# Patient Record
Sex: Male | Born: 1955
Health system: Southern US, Community
[De-identification: ages and names within clinical notes are randomized; demographics above are authoritative.]

## PROBLEM LIST (undated history)

## (undated) DIAGNOSIS — J849 Interstitial pulmonary disease, unspecified: Secondary | ICD-10-CM

## (undated) DIAGNOSIS — K649 Unspecified hemorrhoids: Secondary | ICD-10-CM

## (undated) DIAGNOSIS — E785 Hyperlipidemia, unspecified: Secondary | ICD-10-CM

## (undated) DIAGNOSIS — R000 Tachycardia, unspecified: Secondary | ICD-10-CM

## (undated) DIAGNOSIS — K219 Gastro-esophageal reflux disease without esophagitis: Secondary | ICD-10-CM

## (undated) HISTORY — DX: Unspecified hemorrhoids: K64.9

## (undated) HISTORY — DX: Hyperlipidemia, unspecified: E78.5

## (undated) HISTORY — DX: Gastro-esophageal reflux disease without esophagitis: K21.9

## (undated) HISTORY — DX: Interstitial pulmonary disease, unspecified: J84.9

## (undated) HISTORY — DX: Tachycardia, unspecified: R00.0

---

## 2006-12-01 HISTORY — PX: HERNIA REPAIR: SHX51

## 2015-12-10 ENCOUNTER — Encounter: Payer: Self-pay | Admitting: Internal Medicine

## 2015-12-10 ENCOUNTER — Ambulatory Visit (HOSPITAL_COMMUNITY)
Admission: RE | Admit: 2015-12-10 | Discharge: 2015-12-10 | Disposition: A | Discharge status: Home / Self Care | Payer: Self-pay | Source: Ambulatory Visit | Attending: Internal Medicine | Admitting: Internal Medicine

## 2015-12-10 ENCOUNTER — Ambulatory Visit (INDEPENDENT_AMBULATORY_CARE_PROVIDER_SITE_OTHER): Payer: Self-pay | Admitting: Internal Medicine

## 2015-12-10 VITALS — BP 139/89 | HR 107 | Temp 97.8°F | Wt 182.8 lb

## 2015-12-10 DIAGNOSIS — E7800 Pure hypercholesterolemia, unspecified: Secondary | ICD-10-CM

## 2015-12-10 DIAGNOSIS — R053 Chronic cough: Secondary | ICD-10-CM | POA: Insufficient documentation

## 2015-12-10 DIAGNOSIS — R05 Cough: Secondary | ICD-10-CM

## 2015-12-10 DIAGNOSIS — R000 Tachycardia, unspecified: Secondary | ICD-10-CM

## 2015-12-10 DIAGNOSIS — R918 Other nonspecific abnormal finding of lung field: Secondary | ICD-10-CM | POA: Insufficient documentation

## 2015-12-10 DIAGNOSIS — Z0000 Encounter for general adult medical examination without abnormal findings: Secondary | ICD-10-CM | POA: Insufficient documentation

## 2015-12-10 DIAGNOSIS — Z720 Tobacco use: Secondary | ICD-10-CM

## 2015-12-10 DIAGNOSIS — K649 Unspecified hemorrhoids: Secondary | ICD-10-CM

## 2015-12-10 DIAGNOSIS — K644 Residual hemorrhoidal skin tags: Secondary | ICD-10-CM | POA: Insufficient documentation

## 2015-12-10 DIAGNOSIS — F17210 Nicotine dependence, cigarettes, uncomplicated: Secondary | ICD-10-CM

## 2015-12-10 HISTORY — DX: Unspecified hemorrhoids: K64.9

## 2015-12-10 MED ORDER — HYDROCORTISONE 2.5 % RE CREA: 1.0000 "application " | TOPICAL_CREAM | Freq: Two times a day (BID) | RECTAL | Status: DC

## 2015-12-10 MED ORDER — GUAIFENESIN-DM 100-10 MG/5ML PO SYRP: 5.0000 mL | ORAL_SOLUTION | ORAL | Status: DC | PRN

## 2015-12-10 NOTE — Assessment & Plan Note (Addendum)
Patient reports a history of high cholesterol for which he was previously on medication. No results in our system.   Plan: -Repeat lipid profile  Lipid Panel     Component Value Date/Time   CHOL 173 12/10/2015 1515   TRIG 211* 12/10/2015 1515   HDL 27* 12/10/2015 1515   CHOLHDL 6.4* 12/10/2015 1515   LDLCALC 104* 12/10/2015 1515   Patient would benefit from pravastatin 40 mg daily at the [facility] Department. However, patient does not yet have the orange card. Consider prescribing this once established with the orange card.

## 2015-12-10 NOTE — Progress Notes (Signed)
   Subjective:    Patient ID: Joshua Kennedy, male    DOB: [birth year], 60 y.o.   MRN: [id number]  HPI Joshua Kennedy is a 60 y.o. male with PMHx of tobacco abuse who presents to the clinic to establish care and for cough. Please see A&P for the status of the patient's chronic medical problems.   Past Medical History  Diagnosis Date  . Hemorrhoids 12/10/15  . Hyperlipidemia   . Tachycardia     Outpatient Encounter Prescriptions as of 12/10/2015  Medication Sig  . guaiFENesin-dextromethorphan (ROBITUSSIN DM) 100-10 MG/5ML syrup Take 5 mLs by mouth every 4 (four) hours as needed for cough.  . hydrocortisone (ANUSOL-HC) 2.5 % rectal cream Place 1 application rectally 2 (two) times daily.   No facility-administered encounter medications on file as of 12/10/2015.    Family History  Problem Relation Age of Onset  . Diabetes Sister   . Cancer Sister   . Cancer Brother     Social History   Social History  . Marital Status: Legally Separated    Spouse Name: N/A  . Number of Children: N/A  . Years of Education: N/A   Occupational History  . Not on file.   Social History Main Topics  . Smoking status: Current Every Day Smoker -- 1.00 packs/day    Types: Cigarettes  . Smokeless tobacco: Not on file  . Alcohol Use: No  . Drug Use: No  . Sexual Activity: Not on file   Other Topics Concern  . Not on file   Social History Narrative  . No narrative on file   Patient denies surgical history.  Review of Systems General: Admits to weight loss, night sweats. Denies fever, chills, fatigue, change in appetite  Respiratory: Admits to SOB, productive cough, DOE. Denies hemoptysis, chest tightness, and wheezing.   Cardiovascular: Denies chest pain and palpitations.  Gastrointestinal: Admits to post-tussive vomiting, BRBPR when wiping, hemorrhoids. Denies nausea, abdominal pain, diarrhea, constipation, melena and abdominal distention.  Genitourinary: Denies urgency, frequency. Endocrine:  Denies polyuria, and polydipsia. Musculoskeletal: Denies arthralgias and gait problem.  Skin: Denies rash and wounds.  Neurological: Denies dizziness, headaches, weakness, lightheadedness Psychiatric/Behavioral: Denies sleep disturbance and agitation.     Objective:   Physical Exam Filed Vitals:   12/10/15 1431  BP: 139/89  Pulse: 107  Temp: 97.8 F (36.6 C)  TempSrc: Oral  Weight: 182 lb 12.8 oz (82.918 kg)  SpO2: 95%   General: Vital signs reviewed.  Patient is well-developed and well-nourished, in no acute distress and cooperative with exam.  Head: Normocephalic and atraumatic. Eyes: EOMI, conjunctivae normal, no scleral icterus.  Neck: Supple, trachea midline, no carotid bruit present. +Anterior cervical lymphadenopathy bilaterally. Cardiovascular: Tachycardic, regular rhythm, S1 normal, S2 normal, no murmurs, gallops, or rubs. Pulmonary/Chest: Inspiratory crackles in mid-lower left lung field, no wheezes, or rhonchi. No supraclavicular or axillary lymphadenopathy. Abdominal: Soft, non-tender, non-distended, BS +, no guarding present.  Rectal exam: external hemorrhoids noted, grade 1-2. Chaperoned by Lynn Ito. Extremities: No lower extremity edema bilaterally. No cyanosis or clubbing. Skin: Warm, dry and intact. No rashes or erythema. Psychiatric: Normal mood and affect. speech and behavior is normal. Cognition and memory are normal.      Assessment & Plan:   Please see problem based assessment and plan.

## 2015-12-10 NOTE — Assessment & Plan Note (Addendum)
Patient presents with a one month history of productive cough with clear sputum. He admits to associated 8 pound weight loss in one month, night sweats for 3-4 weeks, increased shortness of breath and DOE which is new for him. He denies associated nasal congestion, sinus pressure, sore throat, or nausea, but he will occasionally have post-tussive vomiting. He denies hemoptysis. He recently began smoking again- 1 ppd for one month. Previously he smoked 1 ppd for 30 years, quit for 8 years. He has tried nyquil, dayquil, and cough drop without relief. Physical exam reveals inspiratory crackles in right mid to lower lung field. Patient states he was routinely tested for TB while incarcerated and has been negative. Most recent test was in November. HIV negative in November.  CXR revealed diffusely increased interstitial markings suggest a pneumonitis type process.  DDx includes COPD, malignancy, TB (given incarceration hx), viral URI, or cough related to tobacco use.   Plan: -Robitussin DM prn cough -Follow up in one month -At that time, if not improved, consider CT Chest and PFTs -CXR findings not concerning for malignancy or TB; however, should be kept on differential -Patient is establishing and trying to obtain orange card

## 2015-12-10 NOTE — Assessment & Plan Note (Signed)
Patient admits to one year history of painless rectal bleeding- BRBPR when he wipes, but occasionally in the toilet. He has never had a colonoscopy. He denies a family history of colon cancer. Physical exam reveals grade 1-2 external hemorrhoids.   Plan: -Anusol cream -Check CBC -Referral to GI in February for further management and for screening colonoscopy

## 2015-12-10 NOTE — Assessment & Plan Note (Addendum)
Patient is tachycardic, but regular rhythm on examination. He denies history of a fast heart rate.   Plan: -Check TSH -Consider EKG at follow up if persistent  TSH normal.

## 2015-12-10 NOTE — Patient Instructions (Addendum)
FOR YOUR COUGH: -WE WILL CHECK A CHEST XRAY -TRY ROBITUSSIN (OR GENERIC FORM WITH DEXTROMETHORPHAN) -WORK ON QUITTING SMOKING  FOR YOUR HEMORRHOIDS: -TRY ANUSOL (HYDROCORTISONE CREAM) APPLIED TO OUTSIDE OF RECTUM -WE WILL ALSO CHECK YOUR BLOOD COUNT TODAY -WE WILL REFER YOU TO GASTROENTEROLOGY NEXT MONTH  WE WILL ALSO CHECK YOUR CHOLESTEROL, THYROID, AND BASIC BLOOD WORK TODAY

## 2015-12-10 NOTE — Assessment & Plan Note (Signed)
  Assessment: Progress toward smoking cessation:   Deteriorated Barriers to progress toward smoking cessation:   Desire to quit Comments: 30 pack year history, quit 8 years, restarted 1 ppd one month ago  Plan: Instruction/counseling given:  I counseled patient on the dangers of tobacco use, advised patient to stop smoking, and reviewed strategies to maximize success. Educational resources provided:    Self management tools provided:    Medications to assist with smoking cessation:  None Patient agreed to the following self-care plans for smoking cessation:    Other plans: Follow up one month

## 2015-12-10 NOTE — Assessment & Plan Note (Signed)
Colonoscopy- Needs, no need for FOBT given history of BRBPR likely d/t hemorrhoids, consider referral next month once patient has orange card Flu Vaccine - Refused Hep C- Defer to follow up visit HIV- states was negative November 2016

## 2015-12-11 LAB — LIPID PANEL
CHOLESTEROL TOTAL: 173 mg/dL (ref 100–199)
Chol/HDL Ratio: 6.4 ratio units — ABNORMAL HIGH (ref 0.0–5.0)
HDL: 27 mg/dL — AB (ref >39)
LDL CALC: 104 mg/dL — AB (ref 0–99)
Triglycerides: 211 mg/dL — ABNORMAL HIGH (ref 0–149)
VLDL CHOLESTEROL CAL: 42 mg/dL — AB (ref 5–40)

## 2015-12-11 LAB — TSH: TSH: 1.36 u[IU]/mL (ref 0.450–4.500)

## 2015-12-11 LAB — CMP14 + ANION GAP
A/G RATIO: 0.9 — AB (ref 1.1–2.5)
ALBUMIN: 3.7 g/dL (ref 3.6–4.8)
ALK PHOS: 140 IU/L — AB (ref 39–117)
ALT: 13 IU/L (ref 0–44)
AST: 22 IU/L (ref 0–40)
Anion Gap: 13 mmol/L (ref 10.0–18.0)
BILIRUBIN TOTAL: 0.3 mg/dL (ref 0.0–1.2)
BUN / CREAT RATIO: 8 — AB (ref 10–22)
BUN: 8 mg/dL (ref 8–27)
CHLORIDE: 98 mmol/L (ref 96–106)
CO2: 25 mmol/L (ref 18–29)
Calcium: 9.4 mg/dL (ref 8.6–10.2)
Creatinine, Ser: 1.03 mg/dL (ref 0.76–1.27)
GFR calc non Af Amer: 79 mL/min/{1.73_m2} (ref >59)
GFR, EST AFRICAN AMERICAN: 91 mL/min/{1.73_m2} (ref >59)
GLOBULIN, TOTAL: 3.9 g/dL (ref 1.5–4.5)
Glucose: 84 mg/dL (ref 65–99)
POTASSIUM: 4.9 mmol/L (ref 3.5–5.2)
SODIUM: 136 mmol/L (ref 134–144)
TOTAL PROTEIN: 7.6 g/dL (ref 6.0–8.5)

## 2015-12-11 LAB — CBC
Hematocrit: 41.3 % (ref 37.5–51.0)
Hemoglobin: 13.3 g/dL (ref 12.6–17.7)
MCH: 26.1 pg — ABNORMAL LOW (ref 26.6–33.0)
MCHC: 32.2 g/dL (ref 31.5–35.7)
MCV: 81 fL (ref 79–97)
PLATELETS: 448 10*3/uL — AB (ref 150–379)
RBC: 5.09 x10E6/uL (ref 4.14–5.80)
RDW: 15.3 % (ref 12.3–15.4)
WBC: 10.4 10*3/uL (ref 3.4–10.8)

## 2015-12-11 NOTE — Progress Notes (Signed)
Internal Medicine Clinic Attending  Case discussed with Dr. Richardson at the time of the visit.  We reviewed the resident's history and exam and pertinent patient test results.  I agree with the assessment, diagnosis, and plan of care documented in the resident's note. 

## 2015-12-12 MED ORDER — PRAVASTATIN SODIUM 40 MG PO TABS: 40.0000 mg | ORAL_TABLET | Freq: Every day | ORAL | Status: DC

## 2015-12-12 NOTE — Addendum Note (Signed)
Addended by: Aldean Baker on: 12/12/2015 10:22 AM   Modules accepted: Orders, Medications

## 2016-01-11 ENCOUNTER — Ambulatory Visit (INDEPENDENT_AMBULATORY_CARE_PROVIDER_SITE_OTHER): Payer: Self-pay | Admitting: Internal Medicine

## 2016-01-11 ENCOUNTER — Encounter: Payer: Self-pay | Admitting: Internal Medicine

## 2016-01-11 VITALS — BP 127/85 | HR 106 | Temp 98.3°F | Ht 70.0 in | Wt 178.6 lb

## 2016-01-11 DIAGNOSIS — R000 Tachycardia, unspecified: Secondary | ICD-10-CM

## 2016-01-11 DIAGNOSIS — R634 Abnormal weight loss: Secondary | ICD-10-CM

## 2016-01-11 DIAGNOSIS — B029 Zoster without complications: Secondary | ICD-10-CM

## 2016-01-11 DIAGNOSIS — Z09 Encounter for follow-up examination after completed treatment for conditions other than malignant neoplasm: Secondary | ICD-10-CM

## 2016-01-11 NOTE — Progress Notes (Signed)
Patient ID: HILMER ALIBERTI, male   DOB: 07/17/1956, 60 y.o.   MRN: [id number]   Subjective:   Patient ID: TRUNG WENZL male   DOB: [birth year] 60 y.o.   MRN: [id number]  HPI: Mr.Damondre E Blancett is a 60 y.o. with PMH tobacco abuse, presented today to follow up on his Cough, weightloss, but also with c/o healing rash on his butt.  Past Medical History  Diagnosis Date  . Hemorrhoids 12/10/15  . Hyperlipidemia   . Tachycardia    Review of Systems: CONSTITUTIONAL- No Fever, or change in appetite. SKIN-  Rash on butt HEAD- No Headache or dizziness. Mouth/throat- No Sorethroat . RESPIRATORY- No Cough or SOB. CARDIAC- No Palpitations,  or chest pain. GI- No vomiting, diarrhoea, abd pain. URINARY- No Frequency or dysuria. NEUROLOGIC- No  syncope, seizures  Objective:  Physical Exam: Filed Vitals:   01/11/16 1416  BP: 127/85  Pulse: 106  Temp: 98.3 F (36.8 C)  TempSrc: Oral  Height: 5\' 10"  (1.778 m)  Weight: 178 lb 9.6 oz (81.012 kg)  SpO2: 100%   GENERAL- alert, co-operative, appears as stated age, not in any distress. HEENT- Atraumatic, normocephalic, PERRL, neck supple. CARDIAC- RRR, no murmurs, rubs or gallops. RESP- clear to auscultation bilaterally, no wheezes or crackles. ABDOMEN- Soft, nontender, bowel sounds present. BACK- Normal curvature of the spine, No tenderness along the vertebrae, no CVA tenderness. NEURO- No obvious Cr N abnormality, strenght upper and lower extremities intact, Gait- Normal. EXTREMITIES- pulse 2+, symmetric, no pedal edema. SKIN- Warm, dry, healing, scabbed over rash - reddish, on right butt cheek, oblique- along a dermatome- L4 or L5., non tender, not pruritic PSYCH- Normal mood and affect, appropriate thought content and speech.  Assessment & Plan:   The patient's case and plan of care was discussed with attending physician, Dr. .  Please see problem based charting for assessment and plan.

## 2016-01-11 NOTE — Assessment & Plan Note (Signed)
Healed and scabbed over lesions on butt- L4 or L5 dermatome. Says he had HIV test done prior to leaving prison- December 1st.  Plan-  repeat HIV test after pt gets orange card

## 2016-01-11 NOTE — Patient Instructions (Addendum)
We want to get a chest CT scan as soon as possible. This is because you are loosing weight, have a cough and have family members that have had a lung Cancer.   Shingles Shingles, which is also known as herpes zoster, is an infection that causes a painful skin rash and fluid-filled blisters. Shingles is not related to genital herpes, which is a sexually transmitted infection.   Shingles only develops in people who:  Have had chickenpox.  Have received the chickenpox vaccine. (This is rare.) CAUSES Shingles is caused by varicella-zoster virus (VZV). This is the same virus that causes chickenpox. After exposure to VZV, the virus stays in the body in an inactive (dormant) state. Shingles develops if the virus reactivates. This can happen many years after the initial exposure to VZV. It is not known what causes this virus to reactivate. RISK FACTORS People who have had chickenpox or received the chickenpox vaccine are at risk for shingles. Infection is more common in people who:  Are older than age 61.  Have a weakened defense (immune) system, such as those with HIV, AIDS, or cancer.  Are taking medicines that weaken the immune system, such as transplant medicines.  Are under great stress. SYMPTOMS Early symptoms of this condition include itching, tingling, and pain in an area on your skin. Pain may be described as burning, stabbing, or throbbing. A few days or weeks after symptoms start, a painful red rash appears, usually on one side of the body in a bandlike or beltlike pattern. The rash eventually turns into fluid-filled blisters that break open, scab over, and dry up in about 2-3 weeks. At any time during the infection, you may also develop:  A fever.  Chills.  A headache.  An upset stomach. DIAGNOSIS This condition is diagnosed with a skin exam. Sometimes, skin or fluid samples are taken from the blisters before a diagnosis is made. These samples are examined under a microscope  or sent to a lab for testing. TREATMENT There is no specific cure for this condition. Your health care provider will probably prescribe medicines to help you manage pain, recover more quickly, and avoid long-term problems. Medicines may include:  Antiviral drugs.  Anti-inflammatory drugs.  Pain medicines. If the area involved is on your face, you may be referred to a specialist, such as an eye doctor (ophthalmologist) or an ear, nose, and throat (ENT) doctor to help you avoid eye problems, chronic pain, or disability. HOME CARE INSTRUCTIONS Medicines  Take medicines only as directed by your health care provider.  Apply an anti-itch or numbing cream to the affected area as directed by your health care provider. Blister and Rash Care  Take a cool bath or apply cool compresses to the area of the rash or blisters as directed by your health care provider. This may help with pain and itching.  Keep your rash covered with a loose bandage (dressing). Wear loose-fitting clothing to help ease the pain of material rubbing against the rash.  Keep your rash and blisters clean with mild soap and cool water or as directed by your health care provider.  Check your rash every day for signs of infection. These include redness, swelling, and pain that lasts or increases.  Do not pick your blisters.  Do not scratch your rash. General Instructions  Rest as directed by your health care provider.  Keep all follow-up visits as directed by your health care provider. This is important.  Until your blisters scab over, your  infection can cause chickenpox in people who have never had it or been vaccinated against it. To prevent this from happening, avoid contact with other people, especially:  Babies.  Pregnant women.  Children who have eczema.  Elderly people who have transplants.  People who have chronic illnesses, such as leukemia or AIDS. SEEK MEDICAL CARE IF:  Your pain is not relieved with  prescribed medicines.  Your pain does not get better after the rash heals.  Your rash looks infected. Signs of infection include redness, swelling, and pain that lasts or increases. SEEK IMMEDIATE MEDICAL CARE IF:  The rash is on your face or nose.  You have facial pain, pain around your eye area, or loss of feeling on one side of your face.  You have ear pain or you have ringing in your ear.  You have loss of taste.  Your condition gets worse.

## 2016-01-11 NOTE — Assessment & Plan Note (Signed)
Due for colonoscopy. Order after getting the orange card.

## 2016-01-11 NOTE — Assessment & Plan Note (Signed)
Patient says he has lost weight, weighed- 197- dec 1st, today he is weighing- 178. He says he has been eating very well with good appetite. Pt also has a 2 month cough  That is about the same, productive of clear sputum. Family hx of Lung cancer in Sister, and brother. Pts chest xray did not show any obvious masses but showed increased interstitial markings.   Plan- Considering- Smoking hx, cough, weightloss, family hx, pt needs a Ct scan ASAP to evaluate for lung masses. - High Resolution Ct scan would be more beneficial, considering interstitial marking, rule out interstitial lund dx, also evaluate for lung masses. - he needs to be a resident of [place] for [place] days, and it cannot be made retroactive to cover prior costs. Pt says he has no money. - next appoint for march 1st when he gets the orange card.

## 2016-01-11 NOTE — Assessment & Plan Note (Signed)
Regular, get EKG next visit after orange card.

## 2016-01-14 NOTE — Progress Notes (Signed)
Internal Medicine Clinic Attending  Case discussed with Dr. Mariea Kennedy soon after the resident saw the patient.  We reviewed the resident's history and exam and pertinent patient test results.  I agree with the assessment, diagnosis, and plan of care documented in the resident's note. Dr Joshua Kennedy and I viewed and interpreted the pt's CXR image. Pt's medical care is limited bc just got out of jail and not eligible for medicaid / food stamps for 6 months nor orange card for 90 day. I conferred with Joshua Kennedy who confirmed this. Orange card is not retroactive. Pt cannot afford W/U now so care will be delayed.

## 2016-01-17 ENCOUNTER — Encounter: Payer: Self-pay | Admitting: *Deleted

## 2016-01-31 ENCOUNTER — Ambulatory Visit: Payer: Self-pay | Admitting: Internal Medicine

## 2016-02-07 ENCOUNTER — Ambulatory Visit (INDEPENDENT_AMBULATORY_CARE_PROVIDER_SITE_OTHER): Payer: Self-pay | Admitting: Internal Medicine

## 2016-02-07 ENCOUNTER — Ambulatory Visit: Payer: Self-pay

## 2016-02-07 ENCOUNTER — Encounter: Payer: Self-pay | Admitting: Internal Medicine

## 2016-02-07 VITALS — BP 121/87 | HR 92 | Temp 97.8°F | Ht 70.0 in | Wt 177.6 lb

## 2016-02-07 DIAGNOSIS — R053 Chronic cough: Secondary | ICD-10-CM

## 2016-02-07 DIAGNOSIS — R05 Cough: Secondary | ICD-10-CM

## 2016-02-07 DIAGNOSIS — F17200 Nicotine dependence, unspecified, uncomplicated: Secondary | ICD-10-CM

## 2016-02-07 DIAGNOSIS — Z720 Tobacco use: Secondary | ICD-10-CM

## 2016-02-07 NOTE — Patient Instructions (Signed)
Please work on getting your orange card.  Take cough medication as needed for cough. We will do the CT scan whenever you have your insurance along with some blood work.

## 2016-02-07 NOTE — Assessment & Plan Note (Signed)
Smoked for many ears until 2008 when he had to stop smoking for prison rules. Now started smoking again since (11/2015) getting out of prison, 1 PPD. Discussed smoking cessation.

## 2016-02-07 NOTE — Assessment & Plan Note (Signed)
Chronic productive cough in a smoker who was incarcerated for 28 years, just got out prison on 11/2015. Cough started since around December. No GERD symptoms, no post nasal drip, not on Ace I. Has family hx of lung cancer (brother and sister). Has weight loss ~12 lbs, no night sweats or hemophthisis. Per patient, had TB test and HIV test in prison before getting out and was negative. Had some exposure to aluminum and vinyl when he worked in the past.  CXR on 12/2015 showed interstitial thickening.  Ddx: TB, chronic bronchitis worsened by recent resumption of smoking, interstitial lung disease.  Still waiting for orange card. States he will get it today. Will have to confirm this with Jaynee Eagles. - collected sputum sample will send off for [employer] NAAT and culture. - ordered high res CT, hopefully he will be able to get the orange card today.  - will do further workup including HIV testing next visit when he gets his orange card. F/up in 1 month.

## 2016-02-07 NOTE — Progress Notes (Signed)
   Subjective:    Patient ID: Joshua Kennedy, male    DOB: 1956-01-22, 60 y.o.   MRN: [id number]  HPI  60 yo male with external hemorrhoids, chronic cough, tobacco use, shingles, weight loss here for f/up   Last visit on 01/11/16: had healed herpes zoster lesions on L4-L5 area on his buttock. Plan was HIV test after he gets orange card. For weght loss (~12 lbs in 2 months and 2 months of cough, family hx of lung cancer in sister and brother) CXR was done recently which showed diffuse interstitial marking. Plan was CT chest for further workup which was delayed until he receives his orange card.   He continues to have cough with productive sputum. Cough worse at night. Wakes up from sleep 4-5 times feeling like he will choke, has to spit out the sputum then go back to sleep. Has milder cough during the day. Continues to smoke 1ppd (restarted smoking after quitting for last 9 years after he got out prison on December, this is same time that he started having the cough). Denies GERD symptoms. Not on AceI. Denies post nasal drip. Denies wheezing/sob. In December he was in 190 lb range now he is 177.6 lb.  Review of Systems  Constitutional: Positive for unexpected weight change. Negative for fever, chills and fatigue.  HENT: Negative for congestion, dental problem, ear pain, postnasal drip, rhinorrhea, sinus pressure, sneezing, sore throat and trouble swallowing.   Eyes: Negative for photophobia and visual disturbance.  Respiratory: Positive for cough and choking. Negative for chest tightness, shortness of breath and wheezing.   Cardiovascular: Negative for chest pain, palpitations and leg swelling.  Gastrointestinal: Negative for nausea, vomiting, abdominal pain, diarrhea and abdominal distention.  Endocrine: Negative for polyuria.  Genitourinary: Negative for dysuria and difficulty urinating.  Musculoskeletal: Negative for back pain and arthralgias.  Skin: Negative.   Allergic/Immunologic:  Negative.   Neurological: Negative for dizziness, facial asymmetry, light-headedness, numbness and headaches.  Hematological: Negative for adenopathy. Does not bruise/bleed easily.  Psychiatric/Behavioral: Negative.        Objective:   Physical Exam  Constitutional: He is oriented to person, place, and time. He appears well-developed and well-nourished.  HENT:  Head: Normocephalic and atraumatic.  Mouth/Throat: No oropharyngeal exudate.  Eyes: Conjunctivae are normal. Pupils are equal, round, and reactive to light.  Neck: Normal range of motion.  Cardiovascular: Normal rate and regular rhythm.  Exam reveals no gallop and no friction rub.   No murmur heard. Pulmonary/Chest: Effort normal.  Has dry crackles bilaterally on lower lobes. No wheezing.  Abdominal: Soft. Bowel sounds are normal. He exhibits no distension. There is no tenderness.  Musculoskeletal: Normal range of motion. He exhibits no edema or tenderness.  Lymphadenopathy:    He has no cervical adenopathy.  Neurological: He is alert and oriented to person, place, and time. No cranial nerve deficit.  Skin: Skin is warm.     Filed Vitals:   02/07/16 1000  BP: 121/87  Pulse: 92  Temp: 97.8 F (36.6 C)        Assessment & Plan:  See problem based a&p.

## 2016-02-08 NOTE — Progress Notes (Signed)
Medicine attending: Medical history, presenting problems, physical findings, and medications, reviewed with resident physician Dr Tasrif Ahmed on the day of the patient visit and I concur with his evaluation and management plan. 

## 2016-02-14 ENCOUNTER — Ambulatory Visit (HOSPITAL_COMMUNITY): Payer: Self-pay

## 2016-02-23 ENCOUNTER — Encounter (HOSPITAL_COMMUNITY): Payer: Self-pay | Admitting: Emergency Medicine

## 2016-02-23 ENCOUNTER — Emergency Department (HOSPITAL_COMMUNITY): Payer: Self-pay

## 2016-02-23 ENCOUNTER — Emergency Department (HOSPITAL_COMMUNITY)
Admission: EM | Admit: 2016-02-23 | Discharge: 2016-02-23 | Disposition: A | Discharge status: Home / Self Care | Payer: Self-pay | Attending: Emergency Medicine | Admitting: Emergency Medicine

## 2016-02-23 DIAGNOSIS — J849 Interstitial pulmonary disease, unspecified: Secondary | ICD-10-CM | POA: Insufficient documentation

## 2016-02-23 DIAGNOSIS — R05 Cough: Secondary | ICD-10-CM | POA: Insufficient documentation

## 2016-02-23 DIAGNOSIS — IMO0001 Reserved for inherently not codable concepts without codable children: Secondary | ICD-10-CM | POA: Insufficient documentation

## 2016-02-23 DIAGNOSIS — R059 Cough, unspecified: Secondary | ICD-10-CM

## 2016-02-23 DIAGNOSIS — R079 Chest pain, unspecified: Secondary | ICD-10-CM | POA: Insufficient documentation

## 2016-02-23 DIAGNOSIS — R911 Solitary pulmonary nodule: Secondary | ICD-10-CM | POA: Insufficient documentation

## 2016-02-23 DIAGNOSIS — Z8639 Personal history of other endocrine, nutritional and metabolic disease: Secondary | ICD-10-CM | POA: Insufficient documentation

## 2016-02-23 DIAGNOSIS — R590 Localized enlarged lymph nodes: Secondary | ICD-10-CM | POA: Insufficient documentation

## 2016-02-23 DIAGNOSIS — F17210 Nicotine dependence, cigarettes, uncomplicated: Secondary | ICD-10-CM | POA: Insufficient documentation

## 2016-02-23 LAB — BASIC METABOLIC PANEL
ANION GAP: 10 (ref 5–15)
BUN: 14 mg/dL (ref 6–20)
CHLORIDE: 101 mmol/L (ref 101–111)
CO2: 23 mmol/L (ref 22–32)
Calcium: 8.7 mg/dL — ABNORMAL LOW (ref 8.9–10.3)
Creatinine, Ser: 1.22 mg/dL (ref 0.61–1.24)
GFR calc Af Amer: >60 mL/min (ref >60)
GLUCOSE: 107 mg/dL — AB (ref 65–99)
POTASSIUM: 4 mmol/L (ref 3.5–5.1)
SODIUM: 134 mmol/L — AB (ref 135–145)

## 2016-02-23 LAB — CBC WITH DIFFERENTIAL/PLATELET
BASOS ABS: 0 10*3/uL (ref 0.0–0.1)
Basophils Relative: 0 %
Eosinophils Absolute: 0.1 10*3/uL (ref 0.0–0.7)
Eosinophils Relative: 1 %
HCT: 36.7 % — ABNORMAL LOW (ref 39.0–52.0)
HEMOGLOBIN: 11.4 g/dL — AB (ref 13.0–17.0)
LYMPHS ABS: 2.6 10*3/uL (ref 0.7–4.0)
LYMPHS PCT: 26 %
MCH: 25.2 pg — AB (ref 26.0–34.0)
MCHC: 31.1 g/dL (ref 30.0–36.0)
MCV: 81 fL (ref 78.0–100.0)
Monocytes Absolute: 0.8 10*3/uL (ref 0.1–1.0)
Monocytes Relative: 8 %
NEUTROS PCT: 65 %
Neutro Abs: 6.5 10*3/uL (ref 1.7–7.7)
Platelets: 292 10*3/uL (ref 150–400)
RBC: 4.53 MIL/uL (ref 4.22–5.81)
RDW: 16.1 % — ABNORMAL HIGH (ref 11.5–15.5)
WBC: 10 10*3/uL (ref 4.0–10.5)

## 2016-02-23 MED ORDER — IPRATROPIUM-ALBUTEROL 0.5-2.5 (3) MG/3ML IN SOLN
3.0000 mL | Freq: Once | RESPIRATORY_TRACT | Status: AC
  Administered 2016-02-23: 3 mL via RESPIRATORY_TRACT
  Filled 2016-02-23: qty 3

## 2016-02-23 MED ORDER — BENZONATATE 100 MG PO CAPS
200.0000 mg | ORAL_CAPSULE | Freq: Once | ORAL | Status: AC
  Administered 2016-02-23: 200 mg via ORAL
  Filled 2016-02-23: qty 2

## 2016-02-23 MED ORDER — SODIUM CHLORIDE 0.9 % IV BOLUS (SEPSIS)
1000.0000 mL | Freq: Once | INTRAVENOUS | Status: AC
  Administered 2016-02-23: 1000 mL via INTRAVENOUS

## 2016-02-23 NOTE — ED Provider Notes (Signed)
CSN: [id number]     Arrival date & time 02/23/16  0421 History   First MD Initiated Contact with Patient 02/23/16 [phone]     Chief Complaint  Patient presents with  . Chest Pain     (Consider location/radiation/quality/duration/timing/severity/associated sxs/prior Treatment) HPI Mr. Thelin is a 60yo male no sig PMH, here with chronic cough x 1 month.  Patient has smoked for 30 yrs, quit for 8, then just picked it back up again.  He has been seeing int med teaching service who have scheduled an outpatient CT scan for evaluation on 3/31.  He states that the cough is productive.  He denies any fevers or sick contacts.  He has no further complaints.  10 Systems reviewed and are negative for acute change except as noted in the HPI.   Past Medical History  Diagnosis Date  . Hemorrhoids 12/10/15  . Hyperlipidemia   . Tachycardia    History reviewed. No pertinent past surgical history. Family History  Problem Relation Age of Onset  . Diabetes Sister   . Cancer Sister   . Cancer Brother    Social History  Substance Use Topics  . Smoking status: Current Every Day Smoker -- 1.00 packs/day    Types: Cigarettes  . Smokeless tobacco: None  . Alcohol Use: No    Review of Systems    Allergies  Review of patient's allergies indicates no known allergies.  Home Medications   Prior to Admission medications   Medication Sig Start Date End Date Taking? Authorizing Provider  guaiFENesin-dextromethorphan (ROBITUSSIN DM) 100-10 MG/5ML syrup Take 5 mLs by mouth every 4 (four) hours as needed for cough. Patient not taking: Reported on 02/23/2016 12/10/15   Alexa Lucrezia Starch, MD  hydrocortisone (ANUSOL-HC) 2.5 % rectal cream Place 1 application rectally 2 (two) times daily. Patient not taking: Reported on 02/23/2016 12/10/15   Alexa Lucrezia Starch, MD   BP 121/86 mmHg  Pulse 88  Temp(Src) 98.4 F (36.9 C) (Oral)  Resp 18  Ht 5\' 10"  (1.778 m)  Wt 180 lb (81.647 kg)  BMI 25.83 kg/m2  SpO2 98% Physical Exam   Constitutional: He is oriented to person, place, and time. Vital signs are normal. He appears well-developed and well-nourished.  Non-toxic appearance. He does not appear ill. No distress.  Frequent coughing.  HENT:  Head: Normocephalic and atraumatic.  Nose: Nose normal.  Mouth/Throat: Oropharynx is clear and moist. No oropharyngeal exudate.  Eyes: Conjunctivae and EOM are normal. Pupils are equal, round, and reactive to light. No scleral icterus.  Neck: Normal range of motion. Neck supple. No tracheal deviation, no edema, no erythema and normal range of motion present. No thyroid mass and no thyromegaly present.  Cardiovascular: Normal rate, regular rhythm, S1 normal, S2 normal, normal heart sounds, intact distal pulses and normal pulses.  Exam reveals no gallop and no friction rub.   No murmur heard. Pulmonary/Chest: Effort normal. No respiratory distress. He has no wheezes. He has no rhonchi. He has rales.  Abdominal: Soft. Normal appearance and bowel sounds are normal. He exhibits no distension, no ascites and no mass. There is no hepatosplenomegaly. There is no tenderness. There is no rebound, no guarding and no CVA tenderness.  Musculoskeletal: Normal range of motion. He exhibits no edema or tenderness.  Lymphadenopathy:    He has no cervical adenopathy.  Neurological: He is alert and oriented to person, place, and time. He has normal strength. No cranial nerve deficit or sensory deficit.  Skin: Skin is warm,  dry and intact. No petechiae and no rash noted. He is not diaphoretic. No erythema. No pallor.  Psychiatric: He has a normal mood and affect. His behavior is normal. Judgment normal.  Nursing note and vitals reviewed.   ED Course  Procedures (including critical care time) Labs Review Labs Reviewed  CBC WITH DIFFERENTIAL/PLATELET - Abnormal; Notable for the following:    Hemoglobin 11.4 (*)    HCT 36.7 (*)    MCH 25.2 (*)    RDW 16.1 (*)    All other components within  normal limits  BASIC METABOLIC PANEL - Abnormal; Notable for the following:    Sodium 134 (*)    Glucose, Bld 107 (*)    Calcium 8.7 (*)    All other components within normal limits    Imaging Review Ct Chest Wo Contrast  02/23/2016  CLINICAL DATA:  60 year old male with chronic cough and chest pain x1 month. EXAM: CT CHEST WITHOUT CONTRAST TECHNIQUE: Multidetector CT imaging of the chest was performed following the standard protocol without IV contrast. COMPARISON:  Chest radiograph dated 12/10/2015 FINDINGS: There is diffuse interstitial prominence with diffuse subpleural reticular and ground-glass densities and areas of honeycombing compatible with underlying interstitial lung disease. There is a 6 mm left upper lobe subpleural nodule. No focal consolidation, pleural effusion, or pneumothorax. The central airways are patent. There is mild atherosclerotic calcification of the thoracic aorta. The central pulmonary arteries appear grossly unremarkable. There is no cardiomegaly or pericardial effusion. There is hypoattenuation of the cardiac blood pool suggestive of a degree of anemia. Clinical correlation is recommended. There is coronary vascular calcification. There is no hilar or mediastinal adenopathy. Evaluation of the hilar and mediastinal is limited in the absence of intravenous contrast. The visualized esophagus and the thyroid gland appear grossly unremarkable. There is no axillary adenopathy. The chest wall soft tissues appear unremarkable. The osseous structures appear intact. The visualized upper abdomen appears unremarkable. IMPRESSION: No acute intrathoracic pathology. Diffuse interstitial prominence and subpleural reticular and ground-glass densities compatible with underlying interstitial lung disease. A 6 mm left upper lobe pulmonary nodule. Non-contrast chest CT at 6-12 months is recommended. If the nodule is stable at time of repeat CT, then future CT at 18-24 months (from today's scan)  is considered optional for low-risk patients, but is recommended for high-risk patients. This recommendation follows the consensus statement: Guidelines for Management of Incidental Pulmonary Nodules Detected on CT Images:From the Fleischner Society 2017; published online before print (10.1148/radiol.[id number]). Electronically Signed   By: Elgie Collard M.D.   On: 02/23/2016 06:40   I have personally reviewed and evaluated these images and lab results as part of my medical decision-making.   EKG Interpretation   Date/Time:  Saturday February 23 2016 04:30:24 EDT Ventricular Rate:  100 PR Interval:  144 QRS Duration: 83 QT Interval:  330 QTC Calculation: 426 R Axis:   -25 Text Interpretation:  Sinus tachycardia Borderline left axis deviation No  old tracing to compare Confirmed by Erroll Luna [phone]) on  02/23/2016 4:39:14 AM      MDM   Final diagnoses:  Cough    Patient presents to the ED for coughing x 1 month.  He was given duoneb and tessalon pearles for treatment.  Will go ahead and obtain CT scan now for evaluation of cough.  Upon repeat evaluation, cough has improved.  Will DC home with tessalon pearles.  He was informed of CT scan findings and need to fu with primary care.  He  appears well and in NAD.  VS remain within his normal limits and he is safe for DC.   Tomasita Crumble, MD 02/23/16 1505

## 2016-02-23 NOTE — ED Notes (Signed)
Pt called out stating he "needs to speak to the doctor now" informed him that EDP was with a critical patient and would be around once he is available.

## 2016-02-23 NOTE — ED Notes (Signed)
Pt in EMS reporting CP present past 1 mo, supposed to have CT scan done done of chest on 31st. Pt reports SOB, N/V. Pain worse with palpation. Recently started smoking after 8 years of cessation. Given 324 ASA, 2 NTG

## 2016-02-23 NOTE — Discharge Instructions (Signed)
Cough, Adult Joshua Kennedy, your CT scan results are below A cough helps to clear your throat and lungs. A cough may last only 2-3 weeks (acute), or it may last longer than 8 weeks (chronic). Many different things can cause a cough. A cough may be a sign of an illness or another medical condition. HOME CARE  Pay attention to any changes in your cough.  Take medicines only as told by your doctor.  If you were prescribed an antibiotic medicine, take it as told by your doctor. Do not stop taking it even if you start to feel better.  Talk with your doctor before you try using a cough medicine.  Drink enough fluid to keep your pee (urine) clear or pale yellow.  If the air is dry, use a cold steam vaporizer or humidifier in your home.  Stay away from things that make you cough at work or at home.  If your cough is worse at night, try using extra pillows to raise your head up higher while you sleep.  Do not smoke, and try not to be around smoke. If you need help quitting, ask your doctor.  Do not have caffeine.  Do not drink alcohol.  Rest as needed. GET HELP IF:  You have new problems (symptoms).  You cough up yellow fluid (pus).  Your cough does not get better after 2-3 weeks, or your cough gets worse.  Medicine does not help your cough and you are not sleeping well.  You have pain that gets worse or pain that is not helped with medicine.  You have a fever.  You are losing weight and you do not know why.  You have night sweats. GET HELP RIGHT AWAY IF:  You cough up blood.  You have trouble breathing.  Your heartbeat is very fast.   This information is not intended to replace advice given to you by your health care provider. Make sure you discuss any questions you have with your health care provider.   Document Released: 07/31/2011 Document Revised: 08/08/2015 Document Reviewed: 01/24/2015 Elsevier Interactive Patient Education [employer]

## 2016-02-23 NOTE — ED Notes (Signed)
EDP at bedside  

## 2016-02-23 NOTE — ED Notes (Signed)
EDP aware that pt wants to speak with him.

## 2016-02-29 ENCOUNTER — Ambulatory Visit (HOSPITAL_COMMUNITY): Payer: Self-pay

## 2016-03-10 ENCOUNTER — Telehealth: Payer: Self-pay | Admitting: Internal Medicine

## 2016-03-10 NOTE — Telephone Encounter (Signed)
APPT. REMINDER CALL, LMTCB °

## 2016-03-11 ENCOUNTER — Ambulatory Visit (INDEPENDENT_AMBULATORY_CARE_PROVIDER_SITE_OTHER): Payer: Self-pay | Admitting: Internal Medicine

## 2016-03-11 ENCOUNTER — Encounter: Payer: Self-pay | Admitting: Internal Medicine

## 2016-03-11 VITALS — BP 109/72 | HR 95 | Temp 97.8°F | Ht 70.0 in | Wt 172.0 lb

## 2016-03-11 DIAGNOSIS — Z801 Family history of malignant neoplasm of trachea, bronchus and lung: Secondary | ICD-10-CM

## 2016-03-11 DIAGNOSIS — R10816 Epigastric abdominal tenderness: Secondary | ICD-10-CM

## 2016-03-11 DIAGNOSIS — F17210 Nicotine dependence, cigarettes, uncomplicated: Secondary | ICD-10-CM

## 2016-03-11 DIAGNOSIS — R634 Abnormal weight loss: Secondary | ICD-10-CM

## 2016-03-11 DIAGNOSIS — Z720 Tobacco use: Secondary | ICD-10-CM

## 2016-03-11 DIAGNOSIS — R05 Cough: Secondary | ICD-10-CM

## 2016-03-11 DIAGNOSIS — R053 Chronic cough: Secondary | ICD-10-CM

## 2016-03-11 DIAGNOSIS — R911 Solitary pulmonary nodule: Secondary | ICD-10-CM

## 2016-03-11 DIAGNOSIS — M7022 Olecranon bursitis, left elbow: Secondary | ICD-10-CM | POA: Insufficient documentation

## 2016-03-11 MED ORDER — OMEPRAZOLE 40 MG PO CPDR: 40.0000 mg | DELAYED_RELEASE_CAPSULE | Freq: Every day | ORAL | Status: DC

## 2016-03-11 MED ORDER — NAPROXEN 500 MG PO TABS: 500.0000 mg | ORAL_TABLET | Freq: Two times a day (BID) | ORAL | Status: DC

## 2016-03-11 MED ORDER — GUAIFENESIN-DM 100-10 MG/5ML PO SYRP: 10.0000 mL | ORAL_SOLUTION | ORAL | Status: DC | PRN

## 2016-03-11 NOTE — Patient Instructions (Signed)
Your symptoms are concerning for possible malignancy and so we should pursue several more studies at this time. Pulmonary function testing will call you to arrange an appointment and study lung volumes. A high resolution CT scan of the chest will better characterize your lung disease. We will refer you to see a pulmonologist for further recommendations and if they would want to do further procedures.  Smoking cessation is the most important step to improving your symptoms of cough and pain. In the meantime I have prescribed a long acting antiinflammatory medicine (Naproxen) to take twice daily. A cough suppressant for reducing cough symptoms at night. And a stomach acid suppressing medicine to take once daily.  For your elbow I recommend an elbow sleeve to cushion pressure on this joint which can be purchased at a general store or sports equipment store. This should continue to improve with time and reliving pressure on it.

## 2016-03-11 NOTE — Progress Notes (Signed)
Subjective:   Patient ID: Joshua Kennedy male   DOB: 10-29-1956 60 y.o.   MRN: [id number]  HPI: Joshua Kennedy is a 60 y.o. man with PMHx detailed below presenting to the clinic for follow up of his chronic cough, chest pain, and weight loss. He completed a 6 year prison term last year. During that time he was screened for TB and HIV which were negative. He has been losing weight unintentionally since partway through last year reportedly at 197 lbs, first recorded in clinic 182 in 12/2015, now 172 today. Over this time he also has persistent productive coughing that is worst at night with copious clear secretions. He had also discontinued smoking while in prison but now resumed 1.5 ppd cigarette use. He was recommended to undergo CT chest for further workup after clinic visit on 3/9 but had not done so by the time his symptoms got worse with associated chest pain and was seen in the ED 3/25. CT scan at that time demonstrated a pattern of ILD also isolated left lung nodule 45mm in diameter. Since discharge he hs continued having his cough symptoms unabated.  See problem based assessment and plan below for additional details.  Past Medical History  Diagnosis Date  . Hemorrhoids 12/10/15  . Hyperlipidemia   . Tachycardia    Current Outpatient Prescriptions  Medication Sig Dispense Refill  . guaiFENesin-dextromethorphan (ROBITUSSIN DM) 100-10 MG/5ML syrup Take 10 mLs by mouth every 4 (four) hours as needed for cough. 236 mL 0  . naproxen (NAPROSYN) 500 MG tablet Take 1 tablet (500 mg total) by mouth 2 (two) times daily with a meal. 60 tablet 2  . omeprazole (PRILOSEC) 40 MG capsule Take 1 capsule (40 mg total) by mouth daily. 30 capsule 2   No current facility-administered medications for this visit.   Family History  Problem Relation Age of Onset  . Diabetes Sister   . Cancer Sister   . Cancer Brother    Social History   Social History  . Marital Status: Legally Separated    Spouse  Name: N/A  . Number of Children: N/A  . Years of Education: N/A   Social History Main Topics  . Smoking status: Current Every Day Smoker -- 1.50 packs/day    Types: Cigarettes  . Smokeless tobacco: None  . Alcohol Use: No  . Drug Use: No  . Sexual Activity: Not Asked   Other Topics Concern  . None   Social History Narrative   Review of Systems: Review of Systems  Constitutional: Positive for chills, weight loss and malaise/fatigue.  HENT: Negative for congestion.   Eyes: Negative for blurred vision.  Respiratory: Positive for cough, sputum production and shortness of breath. Negative for hemoptysis.   Cardiovascular: Positive for chest pain.  Gastrointestinal: Positive for heartburn, abdominal pain and blood in stool. Negative for diarrhea and constipation.  Genitourinary: Negative for flank pain.  Musculoskeletal: Negative for myalgias and falls.  Skin: Negative for rash.  Neurological: Negative for dizziness and headaches.  Endo/Heme/Allergies: Negative for environmental allergies.  Psychiatric/Behavioral: The patient has insomnia.     Objective:  Physical Exam: Filed Vitals:   03/11/16 1414  BP: 109/72  Pulse: 95  Temp: 97.8 F (36.6 C)  TempSrc: Oral  Height: 5\' 10"  (1.778 m)  Weight: 172 lb (78.019 kg)  SpO2: 99%   GENERAL- alert, co-operative, NAD HEENT- Atraumatic, oral mucosa appears moist, no carotid bruit, no cervical LN enlargement CARDIAC- RRR, no murmurs, rubs or gallops.  RESP- Good air movement, faint bibasilar crackles ABDOMEN- Mild epigastric TTP, normoactive bowel sounds, no guarding NEURO- No obvious Cr N abnormality, strength upper and lower extremities- 5/5 EXTREMITIES- no pedal edema, approximately 4cm diameter fluid collection at left olecranon bursa SKIN- Warm, dry, No rash or lesion. PSYCH- Slightly anxious mood, appropriate speech and though content  Assessment & Plan:

## 2016-03-12 NOTE — Progress Notes (Signed)
Procedure note:  Pre-operative Diagnosis: Left olecranon bursitis  Indications: Symptomatic relief of large fluid collection  Anesthesia: Local lidocaine injection 1.40mL  Procedure Details   Verbal consent was obtained for the procedure. The left elbow was prepped with betadine swabs. Using a 22 gauge needle the left olecranon bursa was aspiration with approximately of clear yellow fluid removed. There was no appreciable blood loss. The site was cleaned and an adhesive bandage was placed.  Complications:  None; patient tolerated the procedure well.  Fuller Plan, MD 03/12/2016, 9:10 PM

## 2016-03-12 NOTE — Assessment & Plan Note (Signed)
Assessment: Overall history and presentation is concerning for lung malignancy. The small size of nodule seen would otherwise be appropriate to continue following with radiography at this time. He has 2 siblings with previous diagnoses of NSCLC, he has a 30+ pack-year smoking history, months of unintentional weight loss and worsening cough, and ILD and isolated pulmonary nodule on recent chest CT. This is probably enough that aggressive workup is needed and the patient does way he would be interested in aggressive treatments if it is the case. Unfortunately his nodule is very small and would not be well characterized on PET.  His symptoms could also be explained by underlying COPD now exacerbated with his resumed smoking. Chest CT findings showed possible bronchiectasis as well that could be contributing to his heavy sputum production. Infectious process seems less likely to me and he reports recent negative TB testing in November.  He also has some epigastric tenderness and questionable indigestion symptoms that get worse lying flat. He chronically has hematochezia from hemorrhoids but no known ulcer Hx. I will recommend trial of a PPI to see if this relieves any of his chest or abdominal pain symptoms.  Plan: -Full PFTs -High resolution CT scan -Referral to Pulmonology -Strongly emphasized smoking cessation and its relationship to worsening his symptoms -Continue robitussin DM PRN -Omeprazole 40mg  daily

## 2016-03-12 NOTE — Assessment & Plan Note (Addendum)
Assessment: Bursitis of the left elbow has been present since at least December of last year. He has noticed it for months and thinks it starting improving but then worsened again with more tenderness. He has never injured the site. 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 There is no redness or pain on ROM of the elbow.  Plan: -Aspiration of the left olecranon bursa today -Naproxen 500mg  BID PRN -Recommended elbow sleeve for pressure reduction.

## 2016-03-12 NOTE — Assessment & Plan Note (Signed)
  Assessment: He continues to smoke 1ppd or more today but expressed good insight that smoking is making his pain and cough worse. Also that he is very high risk for lung malignancy and smoking is the worst thing he could be doing for this. He wants to try quitting with nicotine replacement through patches.  Plan: Instruction/counseling given:  I counseled patient on the dangers of tobacco use, advised patient to stop smoking, and reviewed strategies to maximize success. Educational resources provided:  Information handout, quitline contact Medications to assist with smoking cessation:  Nicotine Patch Patient agreed to the following self-care plans for smoking cessation: go to the [employer] ([employer]), call [facility] (1-800-QUIT-NOW), cut down the number of cigarettes smoked

## 2016-03-17 NOTE — Progress Notes (Signed)
I saw and evaluated the patient. I personally confirmed the key portions of Dr. Gregary Cromer history and exam and reviewed pertinent patient test results. The assessment, diagnosis, and plan were formulated together and I agree with the documentation in the resident's note.  With regards to the follow-up of the 6 mm nodule, I would recommend repeat CT scan 6 months after the previous CT scan to assess for changes in size.  The high resolution CT scan does not count.  If no change in 6 months, he will require a repeat CT scan at 1 year, 1 1/2 years, and 2 years from the original CT scan to assure stability in the size of the nodule.  Any growth would warrant referral to thoracic surgery for resection were he found to be a surgical candidate.  I was present at the patient's side and supervised the entire olecranon bursa aspiration procedure.  Joshua Kennedy tolerated the procedure well

## 2016-03-20 ENCOUNTER — Ambulatory Visit (HOSPITAL_COMMUNITY)
Admission: RE | Admit: 2016-03-20 | Discharge: 2016-03-20 | Disposition: A | Discharge status: Home / Self Care | Payer: Self-pay | Source: Ambulatory Visit | Attending: Internal Medicine | Admitting: Internal Medicine

## 2016-03-20 DIAGNOSIS — R053 Chronic cough: Secondary | ICD-10-CM

## 2016-03-20 DIAGNOSIS — R05 Cough: Secondary | ICD-10-CM | POA: Insufficient documentation

## 2016-03-20 LAB — PULMONARY FUNCTION TEST
DL/VA % pred: 47 %
DL/VA: 2.2 ml/min/mmHg/L
DLCO unc % pred: 34 %
DLCO unc: 11.24 ml/min/mmHg
FEF 25-75 Post: 3.09 L/sec
FEF 25-75 Pre: 2.86 L/sec
FEF2575-%CHANGE-POST: 7 %
FEF2575-%Pred-Post: 104 %
FEF2575-%Pred-Pre: 97 %
FEV1-%Change-Post: 2 %
FEV1-%PRED-POST: 86 %
FEV1-%PRED-PRE: 84 %
FEV1-POST: 3.11 L
FEV1-PRE: 3.03 L
FEV1FVC-%CHANGE-POST: 1 %
FEV1FVC-%Pred-Pre: 105 %
FEV6-%CHANGE-POST: 1 %
FEV6-%PRED-PRE: 82 %
FEV6-%Pred-Post: 83 %
FEV6-POST: 3.79 L
FEV6-PRE: 3.74 L
FEV6FVC-%Change-Post: 0 %
FEV6FVC-%PRED-POST: 104 %
FEV6FVC-%PRED-PRE: 104 %
FVC-%CHANGE-POST: 0 %
FVC-%PRED-PRE: 79 %
FVC-%Pred-Post: 80 %
FVC-POST: 3.82 L
FVC-PRE: 3.79 L
POST FEV6/FVC RATIO: 99 %
Post FEV1/FVC ratio: 82 %
Pre FEV1/FVC ratio: 80 %
Pre FEV6/FVC Ratio: 99 %
RV % PRED: 75 %
RV: 1.69 L
TLC % PRED: 80 %
TLC: 5.62 L

## 2016-03-20 MED ORDER — ALBUTEROL SULFATE (2.5 MG/3ML) 0.083% IN NEBU
2.5000 mg | INHALATION_SOLUTION | Freq: Once | RESPIRATORY_TRACT | Status: AC
  Administered 2016-03-20: 2.5 mg via RESPIRATORY_TRACT

## 2016-03-22 LAB — MTB NAA WITH AFB CULTURE
Acid Fast Culture: NEGATIVE
SOURCE: NEGATIVE

## 2016-03-25 ENCOUNTER — Telehealth: Payer: Self-pay

## 2016-03-25 DIAGNOSIS — R05 Cough: Secondary | ICD-10-CM

## 2016-03-25 DIAGNOSIS — R053 Chronic cough: Secondary | ICD-10-CM

## 2016-03-25 NOTE — Telephone Encounter (Signed)
Pt sister requesting the nurse to call back.

## 2016-03-25 NOTE — Telephone Encounter (Signed)
Pt's sister calls and states pt has not heard anything about his referral to pulmonology nor has he been called with the results of radiology tests, could you please advise and call pt or his sister Joshua Kennedy

## 2016-03-28 NOTE — Telephone Encounter (Signed)
Spoke with Joshua Kennedy about his recent PFTs and upcoming pulmonology appointment at 2:30 on 04/10/16. His recent PFTs did not demonstrate a COPD pattern but do indicate moderate diffusion defect. He is aware of his appointment time and date.  I would like to schedule him for an appointment at approximately 1 week after this pulmonology visit if possible. He also needs a high resolution chest CT ideally to also be done in the first half of May.

## 2016-04-09 ENCOUNTER — Encounter (HOSPITAL_COMMUNITY): Payer: Self-pay | Admitting: Emergency Medicine

## 2016-04-09 ENCOUNTER — Emergency Department (HOSPITAL_COMMUNITY)
Admission: EM | Admit: 2016-04-09 | Discharge: 2016-04-09 | Disposition: A | Discharge status: Home / Self Care | Payer: Self-pay | Attending: Emergency Medicine | Admitting: Emergency Medicine

## 2016-04-09 DIAGNOSIS — Z79899 Other long term (current) drug therapy: Secondary | ICD-10-CM | POA: Insufficient documentation

## 2016-04-09 DIAGNOSIS — F17210 Nicotine dependence, cigarettes, uncomplicated: Secondary | ICD-10-CM | POA: Insufficient documentation

## 2016-04-09 DIAGNOSIS — L0291 Cutaneous abscess, unspecified: Secondary | ICD-10-CM

## 2016-04-09 DIAGNOSIS — L02416 Cutaneous abscess of left lower limb: Secondary | ICD-10-CM | POA: Insufficient documentation

## 2016-04-09 DIAGNOSIS — Z8719 Personal history of other diseases of the digestive system: Secondary | ICD-10-CM | POA: Insufficient documentation

## 2016-04-09 DIAGNOSIS — Z8639 Personal history of other endocrine, nutritional and metabolic disease: Secondary | ICD-10-CM | POA: Insufficient documentation

## 2016-04-09 DIAGNOSIS — R630 Anorexia: Secondary | ICD-10-CM | POA: Insufficient documentation

## 2016-04-09 MED ORDER — DOXYCYCLINE HYCLATE 100 MG PO CAPS: 100.0000 mg | ORAL_CAPSULE | Freq: Two times a day (BID) | ORAL | Status: DC

## 2016-04-09 MED ORDER — LIDOCAINE-EPINEPHRINE 1 %-1:100000 IJ SOLN: 10.0000 mL | Freq: Once | INTRAMUSCULAR | Status: DC

## 2016-04-09 MED ORDER — LIDOCAINE-EPINEPHRINE 1 %-1:100000 IJ SOLN
INTRAMUSCULAR | Status: AC
  Filled 2016-04-09: qty 1

## 2016-04-09 MED ORDER — HYDROCODONE-ACETAMINOPHEN 5-325 MG PO TABS: 1.0000 | ORAL_TABLET | Freq: Four times a day (QID) | ORAL | Status: DC | PRN

## 2016-04-09 NOTE — ED Notes (Signed)
Pt states he has an "insect bite" to the inside of his left ankle  Area is red and raised with a dark center, warm to touch  Pt states he did not see what bit him  Pt states today the pain is radiating up to his knee

## 2016-04-09 NOTE — Discharge Instructions (Signed)

## 2016-04-09 NOTE — ED Provider Notes (Signed)
CSN: [id number]     Arrival date & time 04/09/16  2208 History  By signing my name below, I, Octavia Heir, attest that this documentation has been prepared under the direction and in the presence of Roxy Horseman, PA-C. Electronically Signed: Octavia Heir, ED Scribe. 04/09/2016. 10:57 PM.    Chief Complaint  Patient presents with  . Abscess      The history is provided by the patient. No language interpreter was used.   HPI Comments: Joshua Kennedy is a 60 y.o. male who has a PMHx of HLD and tachycardia presents to the Emergency Department complaining of a constant, gradual worsening, moderate abscess to the left ankle onset 4 days ago. Pt has a swollen, erythematous area to his left ankle that has a dark spot in the middle. He notes he has not been able to eat much since having the abscess. Pt believes he was bit by an insect but is unsure what kind. He says today, the pain started to radiate up into his left knee. He has not had this happen to him before.  He denies fever or chills.  Past Medical History  Diagnosis Date  . Hemorrhoids 12/10/15  . Hyperlipidemia   . Tachycardia    History reviewed. No pertinent past surgical history. Family History  Problem Relation Age of Onset  . Diabetes Sister   . Cancer Sister   . Cancer Brother   . Hypertension Other    Social History  Substance Use Topics  . Smoking status: Current Every Day Smoker -- 1.50 packs/day    Types: Cigarettes  . Smokeless tobacco: None  . Alcohol Use: 0.0 oz/week    0 Standard drinks or equivalent per week    Review of Systems  Constitutional: Positive for appetite change. Negative for fever and chills.  Skin: Positive for wound (abscess).  All other systems reviewed and are negative.     Allergies  Review of patient's allergies indicates no known allergies.  Home Medications   Prior to Admission medications   Medication Sig Start Date End Date Taking? Authorizing Provider    guaiFENesin-dextromethorphan (ROBITUSSIN DM) 100-10 MG/5ML syrup Take 10 mLs by mouth every 4 (four) hours as needed for cough. 03/11/16   Fuller Plan, MD  naproxen (NAPROSYN) 500 MG tablet Take 1 tablet (500 mg total) by mouth 2 (two) times daily with a meal. 03/11/16 03/11/17  Fuller Plan, MD  omeprazole (PRILOSEC) 40 MG capsule Take 1 capsule (40 mg total) by mouth daily. 03/11/16   Fuller Plan, MD   Triage vitals: BP 113/79 mmHg  Pulse 108  Temp(Src) 98.4 F (36.9 C) (Oral)  Resp 18  Ht 5\' 10"  (1.778 m)  Wt 172 lb (78.019 kg)  BMI 24.68 kg/m2  SpO2 98% Physical Exam   Physical Exam  Constitutional: Pt is oriented to person, place, and time. Pt appears well-developed and well-nourished. No distress.  HENT:  Head: Normocephalic and atraumatic.  Eyes: Conjunctivae are normal. No scleral icterus.  Neck: Normal range of motion.  Cardiovascular: Normal rate, regular rhythm and intact distal pulses.   Pulmonary/Chest: Effort normal and breath sounds normal.  Abdominal: Soft. Pt exhibits no distension. There is no tenderness.  Lymphadenopathy:    Pt has no cervical adenopathy.  Neurological: Pt is alert and oriented to person, place, and time.  Skin: Skin is warm and dry. Pt is not diaphoretic. There is erythema and tenderness.  Psychiatric: Pt has a normal mood and affect.  Nursing  note and vitals reviewed.        ED Course  Procedures  DIAGNOSTIC STUDIES: Oxygen Saturation is 98% on RA, normal by my interpretation.  COORDINATION OF CARE:  10:38 PM Discussed treatment plan which includes I&D with pt at bedside and pt agreed to plan.  INCISION AND DRAINAGE Performed by: Roxy Horseman Consent: Verbal consent obtained. Risks and benefits: risks, benefits and alternatives were discussed Type: abscess  Body area: left ankle  Anesthesia: local infiltration  Incision was made with a scalpel.  Local anesthetic: lidocaine 1% with  epinephrine  Anesthetic total: 5 ml  Complexity: complex Blunt dissection to break up loculations  Drainage: purulent  Drainage amount: moderate  Packing material: not packed  Patient tolerance: Patient tolerated the procedure well with no immediate complications.     MDM   Final diagnoses:  Abscess    Patient with skin abscess amenable to incision and drainage.  Abscess was not large enough to warrant packing or drain,  wound recheck in 2 days. Encouraged home warm soaks and flushing.  Mild signs of cellulitis is surrounding skin.  Will d/c to home.    I personally performed the services described in this documentation, which was scribed in my presence. The recorded information has been reviewed and is accurate.     Roxy Horseman, PA-C 04/09/16 6222  Bethann Berkshire, MD 04/10/16 [phone]

## 2016-04-10 ENCOUNTER — Other Ambulatory Visit (INDEPENDENT_AMBULATORY_CARE_PROVIDER_SITE_OTHER): Payer: Self-pay

## 2016-04-10 ENCOUNTER — Telehealth: Payer: Self-pay | Admitting: Pulmonary Disease

## 2016-04-10 ENCOUNTER — Encounter: Payer: Self-pay | Admitting: Pulmonary Disease

## 2016-04-10 ENCOUNTER — Ambulatory Visit (INDEPENDENT_AMBULATORY_CARE_PROVIDER_SITE_OTHER): Payer: Self-pay | Admitting: Pulmonary Disease

## 2016-04-10 VITALS — BP 108/72 | HR 94 | Ht 70.0 in | Wt 168.0 lb

## 2016-04-10 DIAGNOSIS — K219 Gastro-esophageal reflux disease without esophagitis: Secondary | ICD-10-CM

## 2016-04-10 DIAGNOSIS — R599 Enlarged lymph nodes, unspecified: Secondary | ICD-10-CM

## 2016-04-10 DIAGNOSIS — J849 Interstitial pulmonary disease, unspecified: Secondary | ICD-10-CM

## 2016-04-10 DIAGNOSIS — R590 Localized enlarged lymph nodes: Secondary | ICD-10-CM

## 2016-04-10 DIAGNOSIS — R911 Solitary pulmonary nodule: Secondary | ICD-10-CM

## 2016-04-10 DIAGNOSIS — IMO0001 Reserved for inherently not codable concepts without codable children: Secondary | ICD-10-CM

## 2016-04-10 DIAGNOSIS — F17200 Nicotine dependence, unspecified, uncomplicated: Secondary | ICD-10-CM

## 2016-04-10 LAB — C-REACTIVE PROTEIN: CRP: 14.3 mg/dL (ref 0.5–20.0)

## 2016-04-10 LAB — SEDIMENTATION RATE: Sed Rate: 89 mm/hr — ABNORMAL HIGH (ref 0–22)

## 2016-04-10 MED FILL — DOXYCYCLINE HYCLATE 100 MG: 100 | 10 days supply | Qty: 20 | Fill #0

## 2016-04-10 MED FILL — HYDROCODON-APAP 5-325: 5-325 | 2 days supply | Qty: 7 | Fill #0

## 2016-04-10 NOTE — Telephone Encounter (Signed)
PFT 03/20/16: FVC 3.79 L (79%) FEV1 3.03 L (84%) FEV1/FVC 0.80 FEF 25-75 2.86 L (97%) no bronchodilator response TLC 5.16 L (80%) RV 75% ERV 97% DLCO uncorrected 34% (Hgb 14.6)  IMAGING CT CHEST W/O 02/23/16 (personally reviewed by me): No pleural effusion or thickening appreciated. 6 mm nodule that is rounded in the lingula which appears to be in the inferior segment. Diffuse bilateral subpleural intralobular septal thickening and subpleural reticular changes with mild associated groundglass consistent with interstitial lung disease. There is some suggestion of honeycomb changes as well although no definitive bronchiectasis. Slight progression towards the sulci bilaterally. No pericardial effusion. Mediastinal adenopathy with largest lymph node precarinal at level 4R measuring 1.7 cm in short axis.  LABS 02/23/16 CBC: 10.0/11.4/36.7/292 BMP: 134/4.0/101/23/14/1.22/107/8.7

## 2016-04-10 NOTE — Patient Instructions (Signed)
   Please call me if you have any new breathing problems before your next appointment  You need to completely quit smoking. He could try using a 14 mg nicotine patch daily with 2 mg nicotine gum intermittently for any cravings.  We will review your test results at her next appointment.  We will to a breathing and walking test at her next appointment in 2-4 weeks.  TESTS ORDERED: 1. Spirometry with DLCO at follow-up appointment 2. 6 minute walk test on room air at follow-up appointment 3. CT chest without contrast June 2017 4. Serum lab work today

## 2016-04-10 NOTE — Progress Notes (Signed)
Subjective:    Patient ID: Joshua Kennedy, male    DOB: Feb 05, 1956, 60 y.o.   MRN: [id number]  HPI He started noticing breathing difficulties starting at the end of December 2016 after he restarted smoking. He reports he noticed coughing first. He reports his dyspnea came shortly thereafter and became progressively worse. Does wheeze at times. Cough produces clear mucus. He reports chest tightness as well as feeling like "lightning" in his center sternum. He was evaluated in the E.D. He does have night sweats. No fever or chills. No rashes or abnormal bruising. He reports he does have stiffness in his hands in the morning that begins to improve after a couple of hours. He reports he has also noticed pain in his feet & knees. He reports his foot will swell at times. He denies any reflux, dyspepsia, or morning brash water taste on Prilosec. No dysphagia or odynophagia. No dry eyes or dry mouth. No oral ulcers.  Review of Systems No dysuria or hematuria. No adenopathy in his neck, groin, or axilla. A pertinent 14 point review of systems is negative except as per the history of presenting illness.  No Known Allergies  Current Outpatient Prescriptions on File Prior to Visit  Medication Sig Dispense Refill  . doxycycline (VIBRAMYCIN) 100 MG capsule Take 1 capsule (100 mg total) by mouth 2 (two) times daily. 20 capsule 0  . omeprazole (PRILOSEC) 40 MG capsule Take 1 capsule (40 mg total) by mouth daily. (Patient taking differently: Take 40 mg by mouth daily as needed (heart burn). ) 30 capsule 2   No current facility-administered medications on file prior to visit.    Past Medical History  Diagnosis Date  . Hemorrhoids 12/10/15  . Hyperlipidemia   . Tachycardia   . ILD (interstitial lung disease) ([place])   . GERD (gastroesophageal reflux disease)     Past Surgical History  Procedure Laterality Date  . Hernia repair  2008    Family History  Problem Relation Age of Onset  . Diabetes Sister     . Hypertension Sister   . Hyperlipidemia Sister   . Asthma Sister   . Lung cancer Brother   . Asthma Brother   . Hypertension Other   . Hypertension Mother   . Cirrhosis Father   . Rheumatologic disease Neg Hx   . Lung cancer Brother   . Emphysema Sister   . Asthma Sister     Social History   Social History  . Marital Status: Single    Spouse Name: N/A  . Number of Children: N/A  . Years of Education: N/A   Social History Main Topics  . Smoking status: Current Every Day Smoker -- 2.00 packs/day for 39 years    Types: Cigarettes    Start date: 09/21/1969  . Smokeless tobacco: Never Used     Comment: Quit for 8 years - 2008 - December 2016  . Alcohol Use: 0.0 oz/week    0 Standard drinks or equivalent per week     Comment: Drinks 6 pack of beer a couple of times weekly  . Drug Use: No     Comment: Remote Cocaine (IV & Intra-nasal) & Marijuana  . Sexual Activity: Not Asked   Other Topics Concern  . None   Social History Narrative   Originally from [place]. Always lived in [place]. Has travel to [place]. Previously has worked Financial planner and in [employer]. No known asbestos exposure. Has a dog & cat. No bird, mold, or hot  tub exposure. No hobbies currently.       Objective:   Physical Exam BP 108/72 mmHg  Pulse 94  Ht '5\' 10"'$  (1.778 m)  Wt 168 lb (76.204 kg)  BMI 24.11 kg/m2  SpO2 99% General:  Awake. Alert. No acute distress. Thin, caucasian male.  Integument:  Warm & dry. No rash on exposed skin. No bruising. Tattoos noted on forearms. Lymphatics:  No appreciated cervical or supraclavicular lymphadenoapthy. HEENT:  Moist mucus membranes. No oral ulcers. No scleral injection or icterus. . Cardiovascular:  Regular rate. No edema. No appreciable JVD.  Pulmonary:  Good aeration bilaterally. Velcro crackles on auscultation. Symmetric chest wall expansion. No accessory muscle use on room air. Abdomen: Soft. Normal bowel sounds. Nondistended. Grossly nontender. Musculoskeletal:   Normal bulk and tone. Hand grip strength 5/5 bilaterally. No joint deformity or effusion appreciated.Mild synovial thickening of bilateral PIP & MCP joints. Neurological:  CN 2-12 grossly in tact. No meningismus. Moving all 4 extremities equally. Symmetric brachioradialis deep tendon reflexes. Psychiatric:  Mood and affect congruent. Speech normal rhythm, rate & tone.   PFT 03/20/16: FVC 3.79 L (79%) FEV1 3.03 L (84%) FEV1/FVC 0.80 FEF 25-75 2.86 L (97%) no bronchodilator response TLC 5.16 L (80%) RV 75% ERV 97% DLCO uncorrected 34% (Hgb 14.6)  IMAGING CT CHEST W/O 02/23/16 (personally reviewed by me): No pleural effusion or thickening appreciated. 6 mm nodule that is rounded in the lingula which appears to be in the inferior segment. Diffuse bilateral subpleural intralobular septal thickening and subpleural reticular changes with mild associated groundglass consistent with interstitial lung disease. There is some suggestion of honeycomb changes as well although no definitive bronchiectasis. Slight progression towards the sulci bilaterally. No pericardial effusion. Mediastinal adenopathy with largest lymph node precarinal at level 4R measuring 1.7 cm in short axis.  LABS 02/23/16 CBC: 10.0/11.4/36.7/292 BMP: 134/4.0/101/23/14/1.22/107/8.7    Assessment & Plan:  60 year old male with findings consistent with interstitial lung disease. With his history of arthritis and joint complaints I am highly suspicious about a potential autoimmune etiology to the findings on his CT imaging. I did review his CT imaging with both he and his sister who is present today. Certainly the timing in relation to his restart of tobacco use raises my concern for possible smoking-related lung disease such as RB ILD or DS IP. However, the overall pattern on his CT does not suggest this. Currently he is scheduled for a high-resolution CT scan next week which will provide [place] with more detailed imaging and hopefully more  information regarding potential diagnoses. I did have a lengthy discussion with the patient and his sister was present regarding the need for complete tobacco cessation to prevent worsening of his lung function beyond what is already present. With a groundglass findings on his CT imaging it is quite possible there is some ongoing inflammation; however, at this time I am not starting prednisone or other immunosuppression given his prior history of drug use and the need for serum testing today as well as accurate imaging next week. As an additional thoughts and concern he does have an enlarged mediastinal lymph node with a 6 mm nodule in his left lung. With his ongoing and prior history of tobacco use as well as family history of malignancy lung cancer is of significant concern. As such, the patient will need to be monitored closely with appropriate CT imaging. I instructed the patient to contact my office if he had any further questions or worsening in his breathing  before his next appointment.  1. ILD: Patient already scheduled for high-resolution CT scan on 5/16. Repeat spirometry with DLCO at next appointment. Checking 6 minute walk test at next appointment. Checking ESR, CRP, ANA, rheumatoid factor, anti-CCP, SSA, SSB, double-stranded DNA antibody, Smith antibody, and hypersensitivity pneumonitis panel. Checking serum QuantiFERON TB, hepatitis C viral PCR, hepatitis B viral surface antigen, hepatitis B surface antibody, & hepatitis B viral core antibody in anticipation of potential need for immunosuppression. 2. 11m Lingula Nodule & Mediastinal Adenopathy: Repeat CT chest without contrast June 2017. 3. GERD: Well-controlled on Prilosec. No changes at this time. 4. Tobacco use disorder: Spent over 3 minutes counseling the patient on the need for complete tobacco cessation. Recommended trying nicotine replacement with patches and gum for intermittent cravings. 5. Follow-up: Return to clinic in 2-4  weeks.  JSonia BallerNAshok Cordia M.D. LSouth Central Regional Medical CenterPulmonary & Critical Care Pager:  [phone] 3pm or if no response, call [phone] 3:34 PM 04/10/2016

## 2016-04-11 LAB — CYCLIC CITRUL PEPTIDE ANTIBODY, IGG

## 2016-04-11 LAB — HEPATITIS B SURFACE ANTIGEN: Hepatitis B Surface Ag: NEGATIVE

## 2016-04-11 LAB — ANTI-DNA ANTIBODY, DOUBLE-STRANDED

## 2016-04-11 LAB — RHEUMATOID FACTOR: RHEUMATOID FACTOR: 289 [IU]/mL — AB (ref <=14)

## 2016-04-11 LAB — ANTI-SMITH ANTIBODY: ENA SM Ab Ser-aCnc: <1

## 2016-04-11 LAB — HEPATITIS B SURFACE ANTIBODY,QUALITATIVE: Hep B S Ab: POSITIVE — AB

## 2016-04-11 LAB — ANA: Anti Nuclear Antibody(ANA): NEGATIVE

## 2016-04-11 LAB — SJOGREN'S SYNDROME ANTIBODS(SSA + SSB)
SSA (Ro) (ENA) Antibody, IgG: <1
SSB (La) (ENA) Antibody, IgG: <1

## 2016-04-11 LAB — HEPATITIS B CORE ANTIBODY, TOTAL: HEP B C TOTAL AB: REACTIVE — AB

## 2016-04-12 LAB — QUANTIFERON TB GOLD ASSAY (BLOOD)
Interferon Gamma Release Assay: NEGATIVE
MITOGEN-NIL SO: 9.92 [IU]/mL
QUANTIFERON NIL VALUE: 0.02 [IU]/mL
QUANTIFERON TB AG MINUS NIL: 0 [IU]/mL

## 2016-04-13 LAB — WOUND CULTURE

## 2016-04-14 ENCOUNTER — Telehealth (HOSPITAL_BASED_OUTPATIENT_CLINIC_OR_DEPARTMENT_OTHER): Payer: Self-pay | Admitting: Emergency Medicine

## 2016-04-14 NOTE — Progress Notes (Signed)
ED Antimicrobial Stewardship Positive Culture Follow Up   Joshua Kennedy is an 60 y.o. male who presented to [facility] on 04/09/2016 with a chief complaint of  Chief Complaint  Patient presents with  . Abscess    Recent Results (from the past 720 hour(s))  Wound culture     Status: None   Collection Time: 04/09/16 11:15 PM  Result Value Ref Range Status   Specimen Description ABSCESS  Final   Special Requests NONE  Final   Gram Stain   Final    RARE WBC PRESENT, PREDOMINANTLY PMN NO SQUAMOUS EPITHELIAL CELLS SEEN FEW GRAM POSITIVE COCCI IN PAIRS Performed at [facility]    Culture   Final    ABUNDANT STAPHYLOCOCCUS AUREUS Note: RIFAMPIN AND GENTAMICIN SHOULD NOT BE USED AS SINGLE DRUGS FOR TREATMENT OF STAPH INFECTIONS. This organism DOES NOT demonstrate inducible Clindamycin resistance in vitro. Performed at [facility]    Report Status 04/13/2016 FINAL  Final   Organism ID, Bacteria STAPHYLOCOCCUS AUREUS  Final      Susceptibility   Staphylococcus aureus - MIC*    CLINDAMYCIN <=0.25 SENSITIVE Sensitive     ERYTHROMYCIN >=8 RESISTANT Resistant     GENTAMICIN <=0.5 SENSITIVE Sensitive     LEVOFLOXACIN 0.25 SENSITIVE Sensitive     OXACILLIN 0.5 SENSITIVE Sensitive     RIFAMPIN <=0.5 SENSITIVE Sensitive     TRIMETH/SULFA <=10 SENSITIVE Sensitive     VANCOMYCIN 1 SENSITIVE Sensitive     TETRACYCLINE >=16 RESISTANT Resistant     MOXIFLOXACIN <=0.25 SENSITIVE Sensitive     * ABUNDANT STAPHYLOCOCCUS AUREUS    [x]  Treated with Doxycycline, organism resistant to prescribed antimicrobial  Since the patient underwent I&D in the [facility] - further antibiotics may not be warranted at this time.   Additional follow-up needed:  D/c Doxy and call for symptom check: - If resolving s/p I&D - no further antibiotics needed at this time - If not resolving and still symptomatic and change to Bactrim DS 1 tab bid x 5 days  ED Provider: PA-C  Althea Grimmer 04/14/2016, 8:22 AM Infectious Diseases Pharmacist Phone# [phone]

## 2016-04-14 NOTE — Telephone Encounter (Addendum)
Post ED Visit - Positive Culture Follow-up: Successful Patient Follow-Up  Culture assessed and recommendations reviewed by: []  , Pharm.D. []  Enzo Bi, Pharm.D., BCPS []  , Pharm.D. [x]  Celedonio Miyamoto, Pharm.D., BCPS []  [place], Garvin Fila.D., BCPS, AAHIVP []  , Pharm.D., BCPS, AAHIVP []  Georgina Pillion, Pharm.D. []  , [place].D.  Positive wound culture  []  Patient discharged without antimicrobial prescription and treatment is now indicated [x]  Organism is resistant to prescribed ED discharge antimicrobial []  Patient with positive blood cultures  Changes discussed with ED provider: [address] [place] New antibiotic prescription stop doxycycline , start bactrim DS 1 tab bid x 5 days Called to [facility] dept @ [phone]  Contacted patient, 04/14/16 1301   Tennis Must 04/14/2016, 12:59 PM

## 2016-04-15 ENCOUNTER — Ambulatory Visit (HOSPITAL_COMMUNITY): Payer: Self-pay

## 2016-04-17 ENCOUNTER — Ambulatory Visit (INDEPENDENT_AMBULATORY_CARE_PROVIDER_SITE_OTHER): Payer: Self-pay | Admitting: Internal Medicine

## 2016-04-17 ENCOUNTER — Encounter: Payer: Self-pay | Admitting: Internal Medicine

## 2016-04-17 VITALS — BP 109/68 | HR 94 | Temp 98.1°F | Ht 70.0 in | Wt 173.7 lb

## 2016-04-17 DIAGNOSIS — J849 Interstitial pulmonary disease, unspecified: Secondary | ICD-10-CM

## 2016-04-17 DIAGNOSIS — M069 Rheumatoid arthritis, unspecified: Secondary | ICD-10-CM | POA: Insufficient documentation

## 2016-04-17 DIAGNOSIS — M0579 Rheumatoid arthritis with rheumatoid factor of multiple sites without organ or systems involvement: Secondary | ICD-10-CM

## 2016-04-17 NOTE — Progress Notes (Signed)
   Subjective:   Patient ID: Joshua Kennedy male   DOB: 1955-12-17 60 y.o.   MRN: [id number]  HPI: [name] is a 60 y.o. male w/ PMHx of GERD, ILD, and HLD, presents to the clinic today for a follow-up visit regarding his lung disease. 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 Patient is scheduled for PFT's and high resolution CT scan later this month and will again follow up with pulmonology in June.   Today, patient says he is doing okay, still gets short of breath after only walking a short distance. Continues to smoke. After long discussion, it does seem that the patient has complaints of severe joint stiffness, mostly in the hands, knees, and ankles in the AM that typically takes up to an hour or so to work through. He says he even soaks his hands in warm water to help with the stiffness, but says that this does not always help. He denies any fever, chills, nausea, or night sweats. He does admit to significant weight loss, per chart review, looks like he has lost ~10 lbs since January.    Past Medical History  Diagnosis Date  . Hemorrhoids 12/10/15  . Hyperlipidemia   . Tachycardia   . ILD (interstitial lung disease) ([place])   . GERD (gastroesophageal reflux disease)    Current Outpatient Prescriptions  Medication Sig Dispense Refill  . doxycycline (VIBRAMYCIN) 100 MG capsule Take 1 capsule (100 mg total) by mouth 2 (two) times daily. 20 capsule 0  . omeprazole (PRILOSEC) 40 MG capsule Take 1 capsule (40 mg total) by mouth daily. (Patient taking differently: Take 40 mg by mouth daily as needed (heart  burn). ) 30 capsule 2   No current facility-administered medications for this visit.    Review of Systems: General: Positive for fatigue. Denies fever, chills, diaphoresis, appetite change.  Respiratory: Positive for SOB/DOE, cough. Denies wheezing.   Cardiovascular: Denies chest pain and palpitations.  Gastrointestinal: Denies nausea, vomiting, abdominal pain, and diarrhea.  Genitourinary: Denies dysuria, increased frequency, and flank pain. Endocrine: Denies hot or cold intolerance, polyuria, and polydipsia. Musculoskeletal: Positive for arthralgias. Denies myalgias, back pain, joint swelling, and gait problem.  Skin: Denies pallor, rash and wounds.  Neurological: Denies dizziness, seizures, syncope, weakness, lightheadedness, numbness and headaches.  Psychiatric/Behavioral: Denies mood changes, and sleep disturbances.  Objective:   Physical Exam: Filed Vitals:   04/17/16 1447  BP: 109/68  Pulse: 94  Temp: 98.1 F (36.7 C)  TempSrc: Oral  Height: _0  (1.778 m)  Weight: 173 lb 11.2 oz (78.79 kg)  SpO2: 98%    General: White male, alert, cooperative, NAD. HEENT: PERRL, EOMI. Moist mucus membranes Neck: Full range of motion without pain, supple, no lymphadenopathy or carotid bruits Lungs: Air entry equal bilaterally. Very faint scattered crackles, otherwise normal lung exam.  Heart: RRR, no murmurs, gallops, or rubs Abdomen: Soft, non-tender, non-distended, BS + Extremities: No cyanosis, clubbing, or edema Neurologic: Alert & oriented X3, cranial nerves II-XII intact, strength grossly intact, sensation intact to light touch   Assessment & Plan:   Please see problem based assessment and plan.

## 2016-04-17 NOTE — Patient Instructions (Addendum)
1. Please schedule an appointment for Monday to drain your elbow bursa.   I have made a referral to see the rheumatologist given that you likely have rheumatoid arthritis.   2. Please take all medications as previously prescribed.  QUIT SMOKING.   3. If you have worsening of your symptoms or new symptoms arise, please call the clinic ([phone]), or go to the ER immediately if symptoms are severe.  You have done a great job in taking all your medications. Please continue to do this.   Steps to Quit Smoking  Smoking tobacco can be harmful to your health and can affect almost every organ in your body. Smoking puts you, and those around you, at risk for developing many serious chronic diseases. Quitting smoking is difficult, but it is one of the best things that you can do for your health. It is never too late to quit. WHAT ARE THE BENEFITS OF QUITTING SMOKING? When you quit smoking, you lower your risk of developing serious diseases and conditions, such as:  Lung cancer or lung disease, such as COPD.  Heart disease.  Stroke.  Heart attack.  Infertility.  Osteoporosis and bone fractures. Additionally, symptoms such as coughing, wheezing, and shortness of breath may get better when you quit. You may also find that you get sick less often because your body is stronger at fighting off colds and infections. If you are pregnant, quitting smoking can help to reduce your chances of having a baby of low birth weight. HOW DO I GET READY TO QUIT? When you decide to quit smoking, create a plan to make sure that you are successful. Before you quit:  Pick a date to quit. Set a date within the next two weeks to give you time to prepare.  Write down the reasons why you are quitting. Keep this list in places where you will see it often, such as on your bathroom mirror or in your car or wallet.  Identify the people, places, things, and activities that make you want to smoke (triggers) and avoid them.  Make sure to take these actions:  Throw away all cigarettes at home, at work, and in your car.  Throw away smoking accessories, such as Set designer.  Clean your car and make sure to empty the ashtray.  Clean your home, including curtains and carpets.  Tell your family, friends, and coworkers that you are quitting. Support from your loved ones can make quitting easier.  Talk with your health care provider about your options for quitting smoking.  Find out what treatment options are covered by your health insurance. WHAT STRATEGIES CAN I USE TO QUIT SMOKING?  Talk with your healthcare provider about different strategies to quit smoking. Some strategies include:  Quitting smoking altogether instead of gradually lessening how much you smoke over a period of time. Research shows that quitting "cold [place]" is more successful than gradually quitting.  Attending in-person counseling to help you build problem-solving skills. You are more likely to have success in quitting if you attend several counseling sessions. Even short sessions of 10 minutes can be effective.  Finding resources and support systems that can help you to quit smoking and remain smoke-free after you quit. These resources are most helpful when you use them often. They can include:  Online chats with a Veterinary surgeon.  Telephone quitlines.  Printed Materials engineer.  Support groups or group counseling.  Text messaging programs.  Mobile phone applications.  Taking medicines to help you quit  smoking. (If you are pregnant or breastfeeding, talk with your health care provider first.) Some medicines contain nicotine and some do not. Both types of medicines help with cravings, but the medicines that include nicotine help to relieve withdrawal symptoms. Your health care provider may recommend:  Nicotine patches, gum, or lozenges.  Nicotine inhalers or sprays.  Non-nicotine medicine that is taken by mouth. Talk  with your health care provider about combining strategies, such as taking medicines while you are also receiving in-person counseling. Using these two strategies together makes you more likely to succeed in quitting than if you used either strategy on its own. If you are pregnant or breastfeeding, talk with your health care provider about finding counseling or other support strategies to quit smoking. Do not take medicine to help you quit smoking unless told to do so by your health care provider. WHAT THINGS CAN I DO TO MAKE IT EASIER TO QUIT? Quitting smoking might feel overwhelming at first, but there is a lot that you can do to make it easier. Take these important actions:  Reach out to your family and friends and ask that they support and encourage you during this time. Call telephone quitlines, reach out to support groups, or work with a counselor for support.  Ask people who smoke to avoid smoking around you.  Avoid places that trigger you to smoke, such as bars, parties, or smoke-break areas at work.  Spend time around people who do not smoke.  Lessen stress in your life, because stress can be a smoking trigger for some people. To lessen stress, try:  Exercising regularly.  Deep-breathing exercises.  Yoga.  Meditating.  Performing a body scan. This involves closing your eyes, scanning your body from head to toe, and noticing which parts of your body are particularly tense. Purposefully relax the muscles in those areas.  Download or purchase mobile phone or tablet apps (applications) that can help you stick to your quit plan by providing reminders, tips, and encouragement. There are many free apps, such as QuitGuide from the [employer] developer for Disease Control and Prevention). You can find other support for quitting smoking (smoking cessation) through smokefree.gov and other websites. HOW WILL I FEEL WHEN I QUIT SMOKING? Within the first 24 hours of quitting smoking, you may start to  feel some withdrawal symptoms. These symptoms are usually most noticeable 2-3 days after quitting, but they usually do not last beyond 2-3 weeks. Changes or symptoms that you might experience include:  Mood swings.  Restlessness, anxiety, or irritation.  Difficulty concentrating.  Dizziness.  Strong cravings for sugary foods in addition to nicotine.  Mild weight gain.  Constipation.  Nausea.  Coughing or a sore throat.  Changes in how your medicines work in your body.  A depressed mood.  Difficulty sleeping (insomnia). After the first 2-3 weeks of quitting, you may start to notice more positive results, such as:  Improved sense of smell and taste.  Decreased coughing and sore throat.  Slower heart rate.  Lower blood pressure.  Clearer skin.  The ability to breathe more easily.  Fewer sick days. Quitting smoking is very challenging for most people. Do not get discouraged if you are not successful the first time. Some people need to make many attempts to quit before they achieve long-term success. Do your best to stick to your quit plan, and talk with your health care provider if you have any questions or concerns.   This information is not intended to replace  advice given to you by your health care provider. Make sure you discuss any questions you have with your health care provider.   Document Released: 11/11/2001 Document Revised: 04/03/2015 Document Reviewed: 04/03/2015 Elsevier Interactive Patient Education [employer]

## 2016-04-18 ENCOUNTER — Ambulatory Visit (HOSPITAL_COMMUNITY)
Admission: RE | Admit: 2016-04-18 | Discharge: 2016-04-18 | Disposition: A | Discharge status: Home / Self Care | Payer: Self-pay | Source: Ambulatory Visit | Attending: Internal Medicine | Admitting: Internal Medicine

## 2016-04-18 DIAGNOSIS — R05 Cough: Secondary | ICD-10-CM | POA: Insufficient documentation

## 2016-04-18 DIAGNOSIS — R590 Localized enlarged lymph nodes: Secondary | ICD-10-CM | POA: Insufficient documentation

## 2016-04-18 DIAGNOSIS — R918 Other nonspecific abnormal finding of lung field: Secondary | ICD-10-CM | POA: Insufficient documentation

## 2016-04-18 DIAGNOSIS — R053 Chronic cough: Secondary | ICD-10-CM

## 2016-04-18 DIAGNOSIS — I2510 Atherosclerotic heart disease of native coronary artery without angina pectoris: Secondary | ICD-10-CM | POA: Insufficient documentation

## 2016-04-18 DIAGNOSIS — J439 Emphysema, unspecified: Secondary | ICD-10-CM | POA: Insufficient documentation

## 2016-04-18 NOTE — Assessment & Plan Note (Signed)
Most likely extra-articular manifestation of Rheumatoid Arthritis. Also admits to joint pain and stiffness for a long time. Has positive RF and anti-CCP. Rest of the labs ordered by pulmonology were generally negative. Patient did have Hep B at one point but has cleared this, evident by his positive Hep B S Ab, positive Hep B core Ab, and negative S Ag. Previous PFT's showed significantly decreased DLCO. CT chest in 01/2016 was significant for diffuse interstitial prominence and subpleural reticular and ground-glass densities compatible with underlying interstitial lung disease. Also showed a 6 mm left upper lobe pulmonary nodule. Both of these findings could be explained by RA.  -Patient will follow up with pulm in the next 1-2 weeks for repeat CT and PFT's -Referral to Rheumatology

## 2016-04-18 NOTE — Progress Notes (Signed)
Medicine attending: Medical history, presenting problems, physical findings, and medications, reviewed with resident physician Dr Eden Jones on the day of the patient visit and I concur with his evaluation and management plan. 

## 2016-04-18 NOTE — Assessment & Plan Note (Signed)
Patient recently presented with DOE, found to have ground glass opacities on CT chest, as well as small pulmonary nodule. PFT's showed decreased DLCO. Seen by pulmonology who ordered several labs, most significant for VERY positive RF and anti-CCP. Patient does admit to significant joint pain and stiffness in the morning especially, mostly in his hands, knees, and ankles.  -Refer to rheumatology

## 2016-04-22 ENCOUNTER — Other Ambulatory Visit: Payer: Self-pay | Admitting: Pulmonary Disease

## 2016-04-22 DIAGNOSIS — R768 Other specified abnormal immunological findings in serum: Secondary | ICD-10-CM

## 2016-04-23 LAB — HCV RT-PCR, QUANT (NON-GRAPH)

## 2016-04-23 LAB — HYPERSENSITIVITY PNEUMONITIS
A. FUMIGATUS #1 ABS: NEGATIVE
A. Pullulans Abs: NEGATIVE
Micropolyspora faeni, IgG: NEGATIVE
Pigeon Serum Abs: NEGATIVE
THERMOACTINOMYCES VULGARIS IGG: NEGATIVE
Thermoact. Saccharii: NEGATIVE

## 2016-04-29 ENCOUNTER — Telehealth: Payer: Self-pay

## 2016-04-29 NOTE — Telephone Encounter (Signed)
Pt sister requesting the nurse to call back regarding antibiotic.

## 2016-04-29 NOTE — Telephone Encounter (Signed)
Patient's sister reports that the abscess on foot is not healed after ABX course given in ED on 04/09/2016. Reports a "hole in his foot" with drainage at times. Denies fever or chills. Attempted to schedule patient for next available tomorrow, but she cannot bring him. Requesting appointment for Thursday 05/01/2016. Added to schedule. Encouraged her to bring him to ED for any fever that develops before appointment.

## 2016-04-30 NOTE — Telephone Encounter (Signed)
Pt's sister calls and cancels appt for today, she states she just had surgery and does not feel like getting out, she states his foot is much improved, denies fevers, drainage, redness, warmth, swelling. appt is changed and advised that if he has fevers, redness, drainage, warmth, swelling then to call clinic or go to urg care or ED, she is agreeable

## 2016-05-01 ENCOUNTER — Encounter: Payer: Self-pay | Admitting: Pulmonary Disease

## 2016-05-12 ENCOUNTER — Encounter: Payer: Self-pay | Admitting: *Deleted

## 2016-05-12 ENCOUNTER — Telehealth: Payer: Self-pay | Admitting: *Deleted

## 2016-05-12 NOTE — Telephone Encounter (Signed)
CALLED PATIENT AND LEFT VOICE MESSAGE FOR PATIENT TO CALL [facility] AT [phone]. THIS IS IN REGARDS TO HIS REFERRAL FOR RHEUMATOLOGY/  SENDING LETTER ALSO TO PATIENT.

## 2016-05-15 ENCOUNTER — Ambulatory Visit: Payer: Self-pay | Admitting: Internal Medicine

## 2016-05-20 ENCOUNTER — Encounter (HOSPITAL_COMMUNITY): Payer: Self-pay

## 2016-05-20 ENCOUNTER — Ambulatory Visit: Payer: Self-pay | Admitting: Pulmonary Disease

## 2016-05-20 ENCOUNTER — Ambulatory Visit: Payer: Self-pay

## 2016-05-26 ENCOUNTER — Ambulatory Visit (INDEPENDENT_AMBULATORY_CARE_PROVIDER_SITE_OTHER)
Admission: RE | Admit: 2016-05-26 | Discharge: 2016-05-26 | Disposition: A | Discharge status: Home / Self Care | Payer: No Typology Code available for payment source | Source: Ambulatory Visit | Attending: Pulmonary Disease | Admitting: Pulmonary Disease

## 2016-05-26 DIAGNOSIS — IMO0001 Reserved for inherently not codable concepts without codable children: Secondary | ICD-10-CM

## 2016-05-26 DIAGNOSIS — R911 Solitary pulmonary nodule: Secondary | ICD-10-CM

## 2016-06-13 ENCOUNTER — Telehealth: Payer: Self-pay | Admitting: Pulmonary Disease

## 2016-06-13 NOTE — Telephone Encounter (Signed)
Result Note     Please let the patient know that I reviewed his chest CT scan. Nothing has significantly changed since his previous test. We will review the results at his follow-up appointment.   Spoke with pt, aware of results/recs.  Nothing further needed.

## 2016-07-03 ENCOUNTER — Ambulatory Visit: Payer: Self-pay | Admitting: Pulmonary Disease

## 2016-07-03 ENCOUNTER — Ambulatory Visit: Payer: Self-pay

## 2016-08-12 ENCOUNTER — Other Ambulatory Visit: Payer: Self-pay | Admitting: *Deleted

## 2016-08-12 DIAGNOSIS — R634 Abnormal weight loss: Secondary | ICD-10-CM

## 2016-08-13 MED ORDER — OMEPRAZOLE 40 MG PO CPDR: 40.0000 mg | DELAYED_RELEASE_CAPSULE | Freq: Every day | ORAL | 2 refills | Status: AC

## 2016-08-22 ENCOUNTER — Ambulatory Visit: Payer: No Typology Code available for payment source

## 2016-08-29 ENCOUNTER — Ambulatory Visit (INDEPENDENT_AMBULATORY_CARE_PROVIDER_SITE_OTHER): Payer: Self-pay

## 2016-08-29 ENCOUNTER — Ambulatory Visit (HOSPITAL_COMMUNITY)
Admission: EM | Admit: 2016-08-29 | Discharge: 2016-08-29 | Disposition: A | Discharge status: Home / Self Care | Payer: Self-pay | Attending: Family Medicine | Admitting: Family Medicine

## 2016-08-29 ENCOUNTER — Encounter (HOSPITAL_COMMUNITY): Payer: Self-pay | Admitting: *Deleted

## 2016-08-29 DIAGNOSIS — R0789 Other chest pain: Secondary | ICD-10-CM

## 2016-08-29 DIAGNOSIS — K2100 Gastro-esophageal reflux disease with esophagitis, without bleeding: Secondary | ICD-10-CM

## 2016-08-29 MED ORDER — SUCRALFATE 1 G PO TABS: 1.0000 g | ORAL_TABLET | Freq: Three times a day (TID) | ORAL | 1 refills | Status: AC

## 2016-08-29 MED FILL — SUCRALFATE 1 GM TABLET: 1 | 25 days supply | Qty: 100 | Fill #0

## 2016-08-29 NOTE — ED Provider Notes (Signed)
[facility]    CSN: [id number] Arrival date & time: 08/29/16  1058     History   Chief Complaint Chief Complaint  Patient presents with  . Chest Pain    HPI Joshua Kennedy is a 60 y.o. male.   The history is provided by the patient.  Chest Pain  Pain location:  Substernal area and epigastric Pain quality: burning and sharp   Pain radiates to:  Does not radiate Pain severity:  Moderate Onset quality:  Gradual Duration:  4 days Progression:  Worsening Chronicity:  New Relieved by:  None tried Worsened by:  Movement Associated symptoms: cough, heartburn, nausea and shortness of breath   Associated symptoms: no abdominal pain, no fever and no palpitations   Risk factors: smoking     Past Medical History:  Diagnosis Date  . GERD (gastroesophageal reflux disease)   . Hemorrhoids 12/10/15  . Hyperlipidemia   . ILD (interstitial lung disease) (HCC)   . Tachycardia     Patient Active Problem List   Diagnosis Date Noted  . Rheumatoid arthritis (HCC) 04/17/2016  . GERD (gastroesophageal reflux disease) 04/10/2016  . Olecranon bursitis of left elbow 03/11/2016  . ILD (interstitial lung disease) (HCC) 02/23/2016  . Lung nodule < 6cm on CT 02/23/2016  . Mediastinal adenopathy 02/23/2016  . Herpes zoster 01/11/2016  . Weight loss 01/11/2016  . Chronic cough 12/10/2015  . Hypercholesterolemia 12/10/2015  . Tachycardia 12/10/2015  . Tobacco abuse 12/10/2015  . External hemorrhoids 12/10/2015  . Healthcare maintenance 12/10/2015    Past Surgical History:  Procedure Laterality Date  . HERNIA REPAIR  2008       Home Medications    Prior to Admission medications   Medication Sig Start Date End Date Taking? Authorizing Provider  doxycycline (VIBRAMYCIN) 100 MG capsule Take 1 capsule (100 mg total) by mouth 2 (two) times daily. 04/09/16   Roxy Horseman, PA-C  omeprazole (PRILOSEC) 40 MG capsule Take 1 capsule (40 mg total) by mouth daily. 08/13/16    Fuller Plan, MD    Family History Family History  Problem Relation Age of Onset  . Diabetes Sister   . Hypertension Sister   . Hyperlipidemia Sister   . Asthma Sister   . Lung cancer Brother   . Asthma Brother   . Hypertension Mother   . Cirrhosis Father   . Lung cancer Brother   . Emphysema Sister   . Asthma Sister   . Hypertension Other   . Rheumatologic disease Neg Hx     Social History Social History  Substance Use Topics  . Smoking status: Current Every Day Smoker    Packs/day: 1.50    Years: 39.00    Types: Cigarettes    Start date: 09/21/1969  . Smokeless tobacco: Never Used     Comment: Quit for 8 years - 2008 - December 2016  . Alcohol use 0.0 oz/week     Comment: Drinks 6 pack of beer a couple of times weekly     Allergies   Review of patient's allergies indicates no known allergies.   Review of Systems Review of Systems  Constitutional: Negative for fever.  HENT: Negative.   Respiratory: Positive for cough and shortness of breath. Negative for wheezing.   Cardiovascular: Positive for chest pain. Negative for palpitations.  Gastrointestinal: Positive for heartburn and nausea. Negative for abdominal pain.  All other systems reviewed and are negative.    Physical Exam Triage Vital Signs ED Triage Vitals [08/29/16  1112]  Enc Vitals Group     BP 99/63     Pulse Rate 84     Resp 12     Temp 98.2 F (36.8 C)     Temp Source Oral     SpO2 98 %     Weight      Height      Head Circumference      Peak Flow      Pain Score      Pain Loc      Pain Edu?      Excl. in GC?    No data found.   Updated Vital Signs BP 99/63 (BP Location: Right Arm)   Pulse 84   Temp 98.2 F (36.8 C) (Oral)   Resp 12   SpO2 98%   Visual Acuity Right Eye Distance:   Left Eye Distance:   Bilateral Distance:    Right Eye Near:   Left Eye Near:    Bilateral Near:     Physical Exam  Constitutional: He is oriented to person, place, and time. He  appears cachectic. He is active. He has a sickly appearance. He appears ill.  Cardiovascular: Normal rate.   Pulmonary/Chest: Effort normal. He has decreased breath sounds. He exhibits tenderness.  Neurological: He is alert and oriented to person, place, and time.  Skin: Skin is warm and dry.  Nursing note and vitals reviewed.    UC Treatments / Results  Labs (all labs ordered are listed, but only abnormal results are displayed) Labs Reviewed - No data to display  EKG  EKG Interpretation None       Radiology No results found.  Procedures Procedures (including critical care time)  Medications Ordered in UC Medications - No data to display   Initial Impression / Assessment and Plan / UC Course  I have reviewed the triage vital signs and the nursing notes.  Pertinent labs & imaging results that were available during my care of the patient were reviewed by me and considered in my medical decision making (see chart for details).  Clinical Course      Final Clinical Impressions(s) / UC Diagnoses   Final diagnoses:  None    New Prescriptions New Prescriptions   No medications on file     Linna Hoff, MD 08/31/16 1141

## 2016-08-29 NOTE — ED Triage Notes (Signed)
Pt  Reports     Shortness     Of   Breath          On  Exertion      Pain     When he  Takes  A  Deep breath    He  States   Has  A  Spot  On  His  Lung  As   Well

## 2016-09-05 ENCOUNTER — Telehealth: Payer: Self-pay | Admitting: Pulmonary Disease

## 2016-09-05 ENCOUNTER — Encounter: Payer: Self-pay | Admitting: Pulmonary Disease

## 2016-09-05 ENCOUNTER — Ambulatory Visit (INDEPENDENT_AMBULATORY_CARE_PROVIDER_SITE_OTHER): Payer: Self-pay | Admitting: Pulmonary Disease

## 2016-09-05 VITALS — BP 126/66 | HR 79 | Ht 70.0 in | Wt 159.0 lb

## 2016-09-05 DIAGNOSIS — J849 Interstitial pulmonary disease, unspecified: Secondary | ICD-10-CM

## 2016-09-05 DIAGNOSIS — F17200 Nicotine dependence, unspecified, uncomplicated: Secondary | ICD-10-CM

## 2016-09-05 DIAGNOSIS — R918 Other nonspecific abnormal finding of lung field: Secondary | ICD-10-CM

## 2016-09-05 DIAGNOSIS — K219 Gastro-esophageal reflux disease without esophagitis: Secondary | ICD-10-CM

## 2016-09-05 DIAGNOSIS — Z23 Encounter for immunization: Secondary | ICD-10-CM

## 2016-09-05 DIAGNOSIS — R0600 Dyspnea, unspecified: Secondary | ICD-10-CM

## 2016-09-05 LAB — PULMONARY FUNCTION TEST
FEF 25-75 Post: 2.42 L/sec
FEF 25-75 Pre: 2.48 L/sec
FEF2575-%Change-Post: -2 %
FEF2575-%Pred-Post: 82 %
FEF2575-%Pred-Pre: 84 %
FEV1-%CHANGE-POST: 0 %
FEV1-%PRED-PRE: 82 %
FEV1-%Pred-Post: 81 %
FEV1-PRE: 2.94 L
FEV1-Post: 2.91 L
FEV1FVC-%Change-Post: 4 %
FEV1FVC-%Pred-Pre: 103 %
FEV6-%Change-Post: -5 %
FEV6-%PRED-POST: 79 %
FEV6-%Pred-Pre: 83 %
FEV6-PRE: 3.77 L
FEV6-Post: 3.58 L
FEV6FVC-%Change-Post: 0 %
FEV6FVC-%PRED-POST: 105 %
FEV6FVC-%PRED-PRE: 104 %
FVC-%CHANGE-POST: -4 %
FVC-%PRED-POST: 76 %
FVC-%PRED-PRE: 79 %
FVC-POST: 3.6 L
FVC-PRE: 3.79 L
POST FEV1/FVC RATIO: 81 %
POST FEV6/FVC RATIO: 100 %
PRE FEV6/FVC RATIO: 100 %
Pre FEV1/FVC ratio: 78 %

## 2016-09-05 NOTE — Progress Notes (Signed)
Test reviewed.  

## 2016-09-05 NOTE — Patient Instructions (Signed)
   Call me if you need any refills on your Prilosec or Carafate.  We need to make sure you are having absolutely zero reflux or indigestion as it can make your lungs worse.  I will see you back with a breathing test and walking test at your next appointment.  Call me if you have any questions or concerns before your next appointment.  Try using Nicotine Gum 4mg  which you can buy over-the-counter in place of cigarettes to get you off of them.  TESTS ORDERED: 1. CT Chest w/o 2. Spirometry with DLCO at next appointment 3. on room air at next appointment

## 2016-09-05 NOTE — Progress Notes (Signed)
Subjective:    Joshua Kennedy ID: Joshua Kennedy, male    DOB: [birth year], 60 y.o.   MRN: [id number]  C.C.:  Follow-up for ILD, Multiple Lung Nodules w/ Mediastinal Adenopathy, GERD, & Tobacco Use Disorder.   HPI ILD:  Basilar predominant on HRCT & could represent early UIP. Serum autoimmune workup highly suggestive for rheumatoid arthritis. He feels like his dyspnea is getting worse. He reports he continues to have a cough productive of clear mucus. Does have wheezing at night when laying recumbent. No rashes or abnormal bruising. He is supposed to be having an appointment with Rheumatology at [facility].   Multiple Lung Nodules & Mediastinal Adenopathy:  Seen on CT imaging first in March 2017. Lingula nodule measured 16m initially. Found to have 459mRML nodule in June 2017. No adenopathy in his neck, groin, or axilla.   GERD:  On Prilosec. Reports no reflux, dyspepsia or morning brash water taste. Now taking Carafate which has helped his chest discomfort.  Tobacco Use Disorder:  Recommended nicotine replacement at last appointment. He reports he has cut back to <1ppd. He hasn't tried nicotine replacement.   Review of Systems Previously was having intermittent chest discomfort for 1.5 months. Reports pain resolved after he started Prilosec. No fever, chills, or sweats. He has lost about 40 pounds since December 2016. He does have some intermittent nausea. No abdominal pain.   No Known Allergies  Current Outpatient Prescriptions on File Prior to Visit  Medication Sig Dispense Refill  . omeprazole (PRILOSEC) 40 MG capsule Take 1 capsule (40 mg total) by mouth daily. 30 capsule 2  . sucralfate (CARAFATE) 1 g tablet Take 1 tablet (1 g total) by mouth 4 (four) times daily -  with meals and at bedtime. 100 tablet 1   No current facility-administered medications on file prior to visit.     Past Medical History:  Diagnosis Date  . GERD (gastroesophageal reflux disease)   . Hemorrhoids 12/10/15  .  Hyperlipidemia   . ILD (interstitial lung disease) [place] [place]  . Tachycardia     Past Surgical History:  Procedure Laterality Date  . HERNIA REPAIR  2008    Family History  Problem Relation Age of Onset  . Diabetes Sister   . Hypertension Sister   . Hyperlipidemia Sister   . Asthma Sister   . Lung cancer Brother   . Asthma Brother   . Hypertension Mother   . Cirrhosis Father   . Lung cancer Brother   . Emphysema Sister   . Asthma Sister   . Hypertension Other   . Rheumatologic disease Neg Hx     Social History   Social History  . Marital status: Single    Spouse name: N/A  . Number of children: N/A  . Years of education: N/A   Social History Main Topics  . Smoking status: Current Every Day Smoker    Packs/day: 1.50    Years: 39.00    Types: Cigarettes    Start date: 09/21/1969  . Smokeless tobacco: Never Used     Comment: down to 1ppd 09/05/16  . Alcohol use 0.0 oz/week     Comment: Drinks 6 pack of beer a couple of times weekly  . Drug use: No     Comment: Remote Cocaine (IV & Intra-nasal) & Marijuana  . Sexual activity: Not Asked   Other Topics Concern  . None   Social History Narrative   [place] Previously has worked Financial planner and in [employer]. No known asbestos exposure. Has a dog & cat. No bird, mold, or hot tub exposure. No hobbies currently.       Objective:   Physical Exam BP 126/66 (BP Location: Left Arm, Cuff Size: Normal)   Pulse 79   Ht '5\' 10"'$  (1.778 m)   Wt 159 lb (72.1 kg)   SpO2 98%   BMI 22.81 kg/m  General:  Awake. Alert. No distress.  Integument:  Warm & dry. No rash on exposed skin. Tattoos noted on forearms. Lymphatics:  No appreciated cervical or supraclavicular lymphadenoapthy. HEENT:  Moist mucus membranes. No oral ulcers. Minimal nasal turbinate swelling. No scleral icterus. Cardiovascular:  Regular rate. No edema. Normal S1 & S2. Pulmonary:  Persistent basilar Velcro crackles.  Good aeration bilaterally otherwise. Normal work of breathing on room air without accessory muscle use.  PFT 09/05/16: FVC 3.79 L (79%) FEV1 2.94 L (82%) FEV1/FVC 0.78 FEF 25-75 2.48 L (84%) no bronchodilator response 03/20/16: FVC 3.79 L (79%) FEV1 3.03 L (84%) FEV1/FVC 0.80 FEF 25-75 2.86 L (97%) no bronchodilator response TLC 5.16 L (80%) RV 75% ERV 97% DLCO uncorrected 34%   6MWT 09/05/16:  Walked 432 meters / Baseline Sat 99% on RA / Nadir Sat 99% on RA  IMAGING CT CHEST W/O 05/26/16 (personally reviewed by me): 8 mm nodule within the lingula as well as 4 mm nodule within right middle lobe. Upper lobe predominant emphysematous changes. No pleural effusion or thickening. Recurrent lymphadenopathy measuring at most 1.3 cm in short axis but with clustering could represent conglomerate lymph nodes. Single lymph node precarinal measuring 1.1 cm. No other appreciable pathologic mediastinal adenopathy. Subpleural reticular changes that are basilar predominant again noted.  HRCT CHEST W/O 04/18/16 (personally reviewed by me): Basilar predominant subpleural intralobular septal thickening and reticulation consistent with interstitial lung disease. No definite honeycomb changes or groundglass opacities. Borderline bronchiectasis. Again noted lingula nodule measuring approximately 6 mm. No mediastinal mass appreciated. No pleural effusion or thickening.  CT CHEST W/O 02/23/16 (previously reviewed by me): No pleural effusion or thickening appreciated. 6 mm nodule that is rounded in the lingula which appears to be in the inferior segment. Diffuse bilateral subpleural intralobular septal thickening and subpleural reticular changes with mild associated groundglass consistent with interstitial lung disease. There is some suggestion of honeycomb changes as well although no definitive bronchiectasis. Slight progression towards the sulci bilaterally. No pericardial effusion. Mediastinal adenopathy with largest lymph  node precarinal at level 4R measuring 1.7 cm in short axis.  MICROBIOLOGY HBV Surface Ag (04/10/16):  Negative HBV Surface Ab (04/10/16):  Positive HBV Core Ab (04/10/16):  Reactive HCV Quantitative (04/10/16):  <2 Quantiferon TB (04/10/16):  Negative   LABS 04/10/16 ESR:  89 CRP:  14.3 ANA:  Negative Anti-CCP:  >250 RF:  289 DS DNA Ab:  <1 Smith Ab:  <1 SSA:  <1.0 SSB:  <1.0 Hypersensitivity Pneumonitis Panel:  Negative   02/23/16 CBC: 10.0/11.4/36.7/292 BMP: 134/4.0/101/23/14/1.22/107/8.7    Assessment & Plan:  60 y.o. male with interstitial lung disease & lung nodule with mediastinal lymphadenopathy. Serum workup revealed findings suggestive of Rheumatoid arthritis. Remainder workup relatively negative. Joshua Kennedy's FVC has remained stable when compared with previous pulmonary function testing. Additionally, his 6 minute walk test distance shows no evidence of desaturation. Given the stability in his pulmonary function testing I do not feel that initiation of immunosuppression is necessary at this time and will await evaluation by rheumatology. I did discuss the Joshua Kennedy's  chest CT scan from June which showed a 4 mm right middle lobe nodule which was new but showed continued stability in his lingula nodule. Given his ongoing tobacco use I feel that continued monitoring for signs of progression which would indicate malignancy is necessary. We also discussed his need for complete tobacco cessation with his underlying interstitial lung disease as this could certainly be a precipitating cause. The Joshua Kennedy has not yet tried nicotine replacement. I instructed the Joshua Kennedy to notify my office if he had any new breathing problems or questions before his next appointment.  1. ILD: Continuing to hold on immunosuppression. Repeat spirometry with DLCO at next appointment along with 6 minute walk test on room air. Await evaluation by rheumatology regarding possible rheumatoid arthritis.  2. Multiple Lung  Nodules & Mediastinal Adenopathy: Repeat CT chest without contrast. 3. Tobacco Use Disorder:  Counseled for over 3 minutes and need for complete tobacco cessation. Recommended using nicotine gum for replacement of nicotine.  4. GERD:  Well-controlled with Prilosec & Carafate. No changes at this time.  5. Health Maintenance:  Previously had bad reaction to Influenza Vaccine. Administering Pneumovax 23 today.  6. Follow-up: Return to clinic in 3 months or sooner if needed.   Sonia Baller Ashok Cordia, M.D. [facility] Pager:  [phone] After 3pm or if no response, call [phone] 12:22 PM 09/05/16

## 2016-09-05 NOTE — Telephone Encounter (Signed)
IMAGING CT CHEST W/O 05/26/16 (personally reviewed by me): 8 mm nodule within the lingula as well as 4 mm nodule within right middle lobe. Upper lobe predominant emphysematous changes. No pleural effusion or thickening. Recurrent lymphadenopathy measuring at most 1.3 cm in short axis but with clustering could represent conglomerate lymph nodes. Single lymph node precarinal measuring 1.1 cm. No other appreciable pathologic mediastinal adenopathy. Subpleural reticular changes that are basilar predominant again noted.  HRCT CHEST W/O 04/18/16 (personally reviewed by me): Basilar predominant subpleural intralobular septal thickening and reticulation consistent with interstitial lung disease. No definite honeycomb changes or groundglass opacities. Borderline bronchiectasis. Again noted lingula nodule measuring approximately 6 mm. No mediastinal mass appreciated. No pleural effusion or thickening.  MICROBIOLOGY HBV Surface Ag (04/10/16):  Negative HBV Surface Ab (04/10/16):  Positive HBV Core Ab (04/10/16):  Reactive HCV Quantitative (04/10/16):  <2 Quantiferon TB (04/10/16):  Negative   LABS 04/10/16 ESR:  89 CRP:  14.3 ANA:  Negative Anti-CCP:  >250 RF:  289 DS DNA Ab:  <1 Smith Ab:  <1 SSA:  <1.0 SSB:  <1.0 Hypersensitivity Pneumonitis Panel:  Negative

## 2016-09-08 ENCOUNTER — Telehealth: Payer: Self-pay

## 2016-09-08 NOTE — Telephone Encounter (Signed)
On 09/08/2016 I received a death certificate from [facility] (original). The death certificate is for cremation. The patient is a patient of [employer], [employer]/film [employer]. The death certificate will be taken to [facility] ([id number]) this am for signature.  On 09/09/2016 I received a phone call that Doctor Jamison Neighbor would not be the physician to sign the death certificate. I called the funeral home to let them know this information and they picked up the death certificate.

## 2016-10-01 DEATH — deceased

## 2018-04-23 IMAGING — CT CT CHEST W/O CM
2 of 3 series · 15 of 36 positions shown, 18 images · non-contrast
Comparison: CT chest dated 04/18/2016 and CT chest dated
02/23/2016.

CLINICAL DATA: Follow-up lung nodule

EXAM:
CT CHEST WITHOUT CONTRAST
TECHNIQUE: Multidetector CT imaging of the chest was performed following the
standard protocol without IV contrast.

[Series 2: thorax · axial · 0.71mm/px · z∈[-289,-15]mm · 12 of 161 slices shown, 15 images]
[im 12/161  mediastinal]
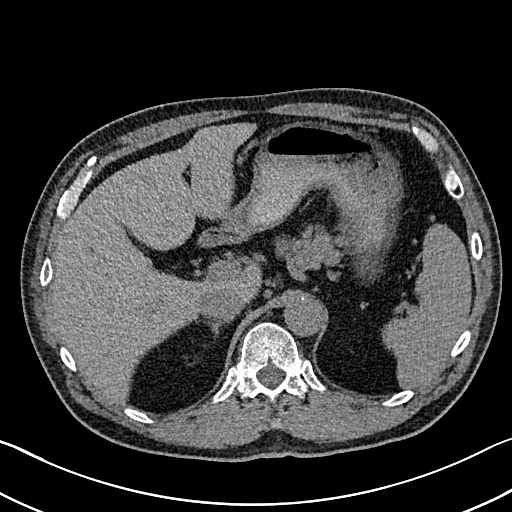
[im 12/161  lung]
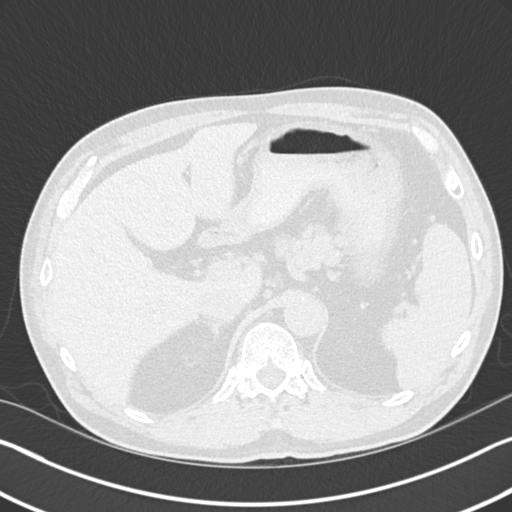
[im 24/161  lung]
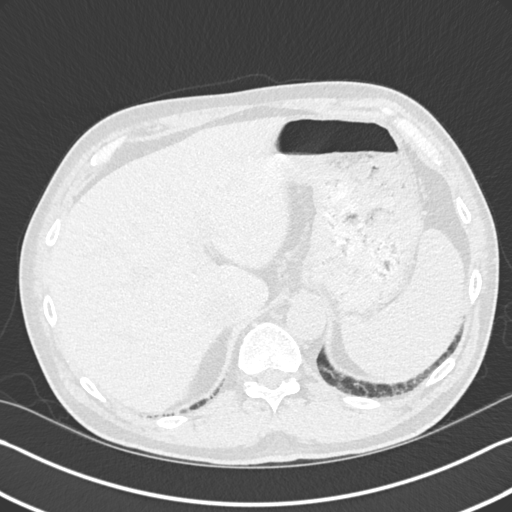
[im 36/161  lung]
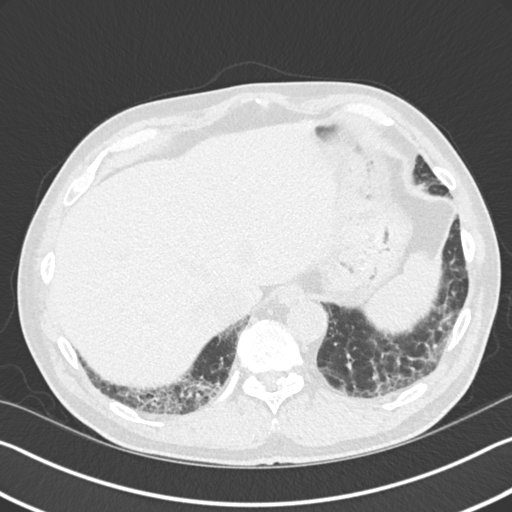
[im 48/161  lung]
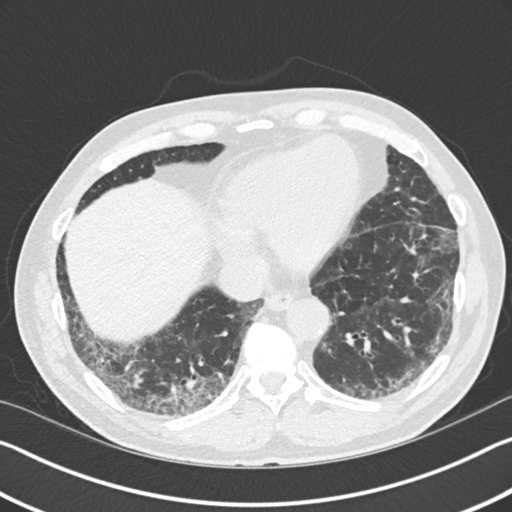
[im 60/161  mediastinal]
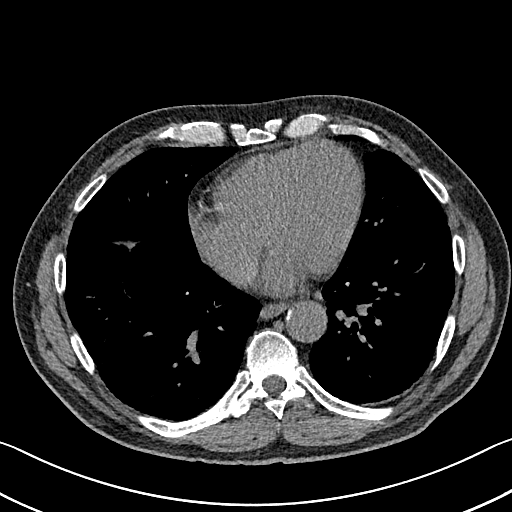
[im 60/161  lung]
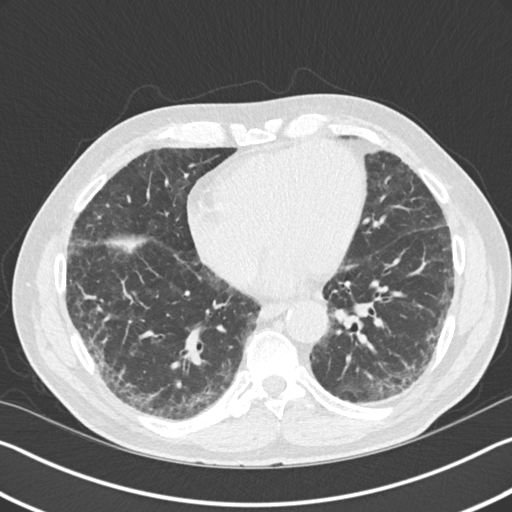
[im 72/161  lung]
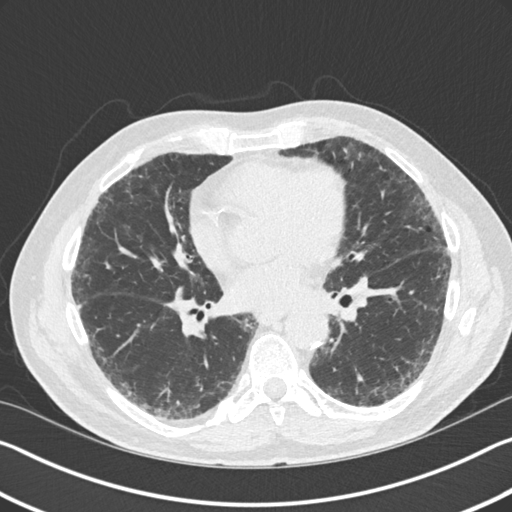
[im 89/161  lung]
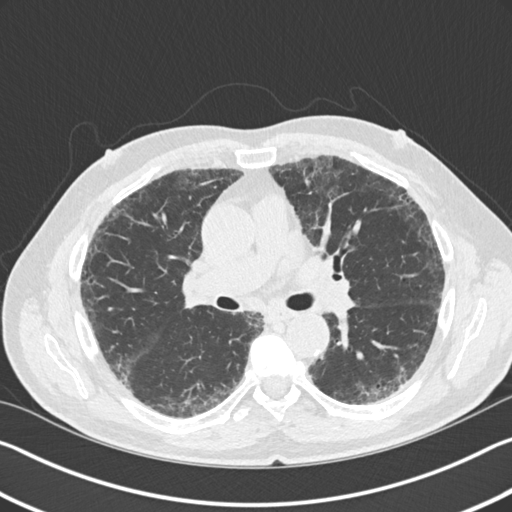
[im 101/161  lung]
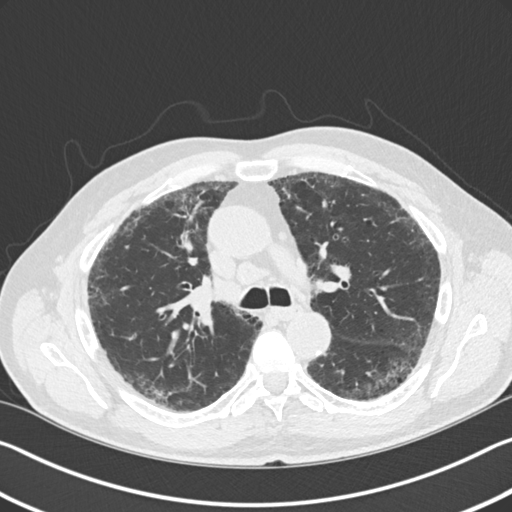
[im 113/161  mediastinal]
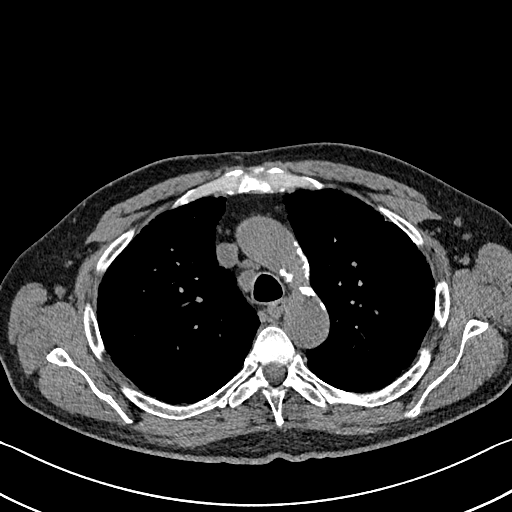
[im 113/161  lung]
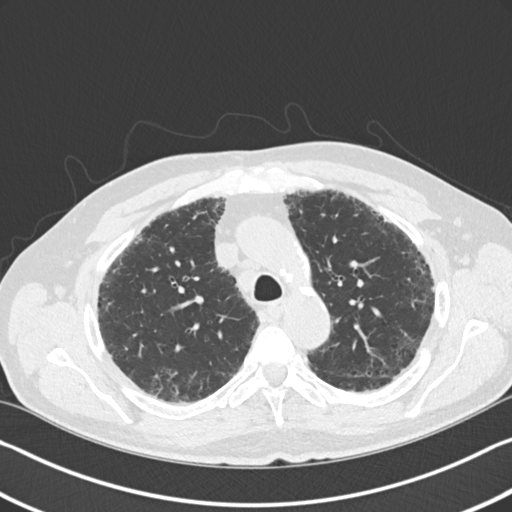
[im 125/161  lung]
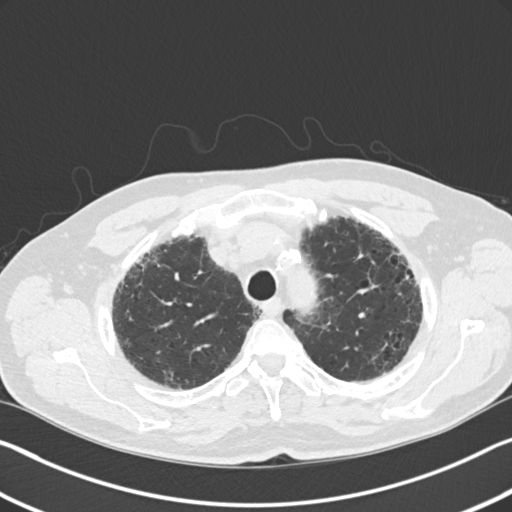
[im 137/161  lung]
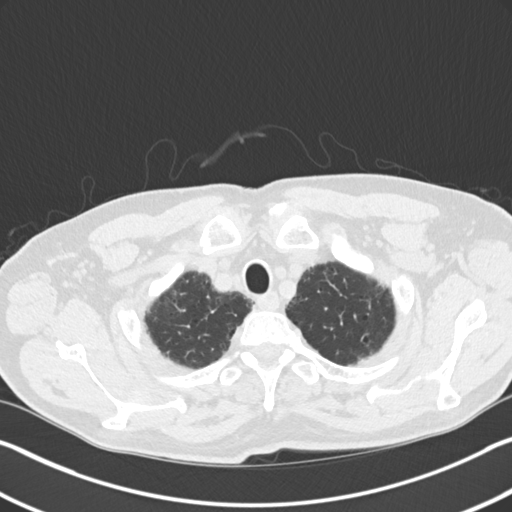
[im 149/161  lung]
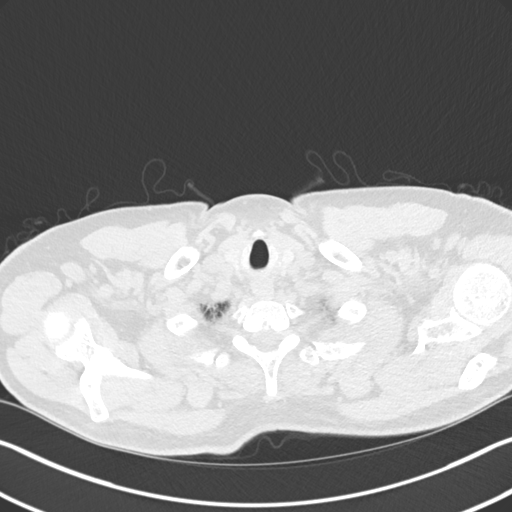

[Series 5: coronal · coronal · 0.63mm/px · 3 of 122 slices shown]
[im 25/122  lung]
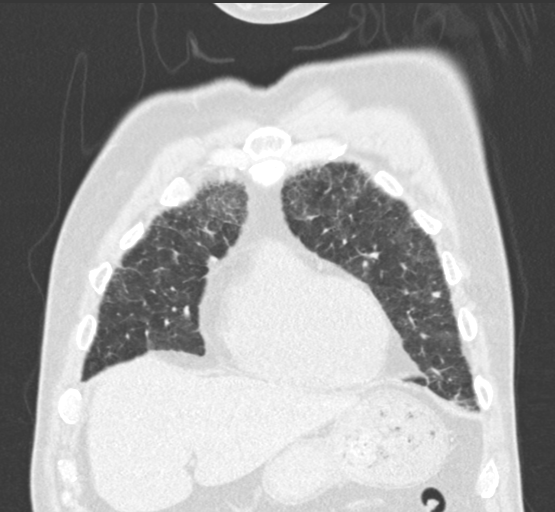
[im 49/122  lung]
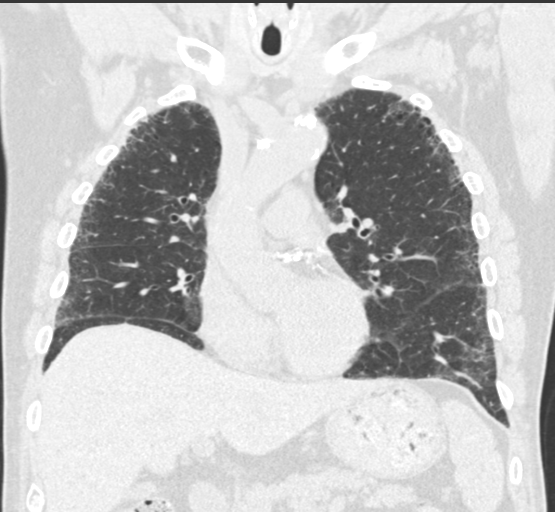
[im 73/122  lung]
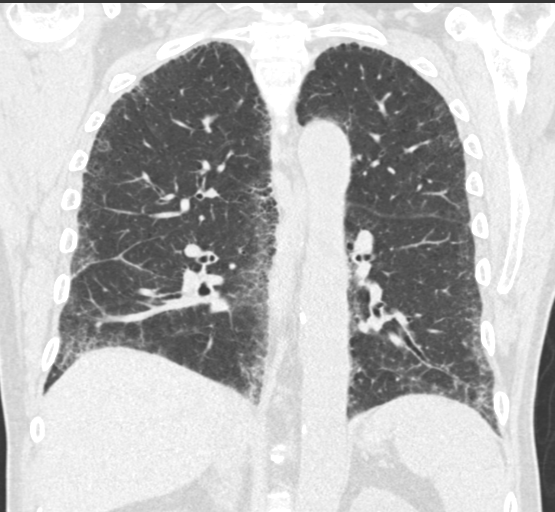

[15 of 36 positions shown; findings below may reference images not displayed]

FINDINGS: Mediastinum/Lymph Nodes: Scattered atherosclerotic changes of the
normal caliber thoracic aorta. Heart size is normal. No pericardial
effusion. Coronary artery calcifications noted. Mildly prominent
lymph nodes in the mediastinum are stable. No new or enlarged lymph
nodes.

Lungs/Pleura: 7 mm pulmonary nodule again noted within the lingula.
This is not significantly changed in size compared to the earlier
studies of 04/18/2016 and 02/23/2016. I measure the nodule on the
study of 02/23/2016 to also be 7 mm greatest dimension. There is
also a stable 3 mm pulmonary nodule within the right middle lobe.

Again noted are the large areas of ground-glass attenuation, septal
thickening, subpleural reticulation and peripheral bronchiectasis
bilaterally. Small area of early honeycombing again noted within the
right lower lobe posteriorly. The areas of centrilobular and
paraseptal emphysema appears stable.

No evidence of pneumonia. No new nodules or masses within either
lung. No pleural effusion. No pneumothorax.

Upper abdomen: No acute findings.

Musculoskeletal: Degenerative changes again noted within the
thoracic spine. No acute or suspicious osseous lesion. Superficial
soft tissues are unremarkable.
IMPRESSION: 1. Stable 7 mm pulmonary nodule within the lingula. Additional
stable 3 mm pulmonary nodule within the right middle lobe.
Non-contrast chest CT at 3-6 months is recommended. If the nodules
are stable at time of repeat CT, then future CT at 18-24 months
(from today's scan) is considered optional for low-risk patients,
but is recommended for high-risk patients. This recommendation
follows the consensus statement: Guidelines for Management of
Incidental Pulmonary Nodules Detected on CT Images:From the
[HOSPITAL] 0177; published online before print
(10.1148/radiol.[id number]).
2. Findings again compatible with diffuse interstitial lung disease,
lower lobe predominant, unchanged compared to the previous study.
Emphysematous changes, upper lobe predominant, also appear stable.
3. Mild mediastinal lymphadenopathy is stable, presumably chronic
and reactive.
4. Aortic atherosclerosis.
5. Prominent coronary artery calcifications. Recommend correlation
with any possible associated cardiac symptoms. Heart size is normal.

## 2018-07-27 IMAGING — DX DG CHEST 2V
2 series · 2 of 2 positions shown · non-contrast
Comparison: CT 05/26/2016

CLINICAL DATA: Left-sided chest pain.  Tobacco use.

EXAM:
CHEST  2 VIEW

[chest pa]
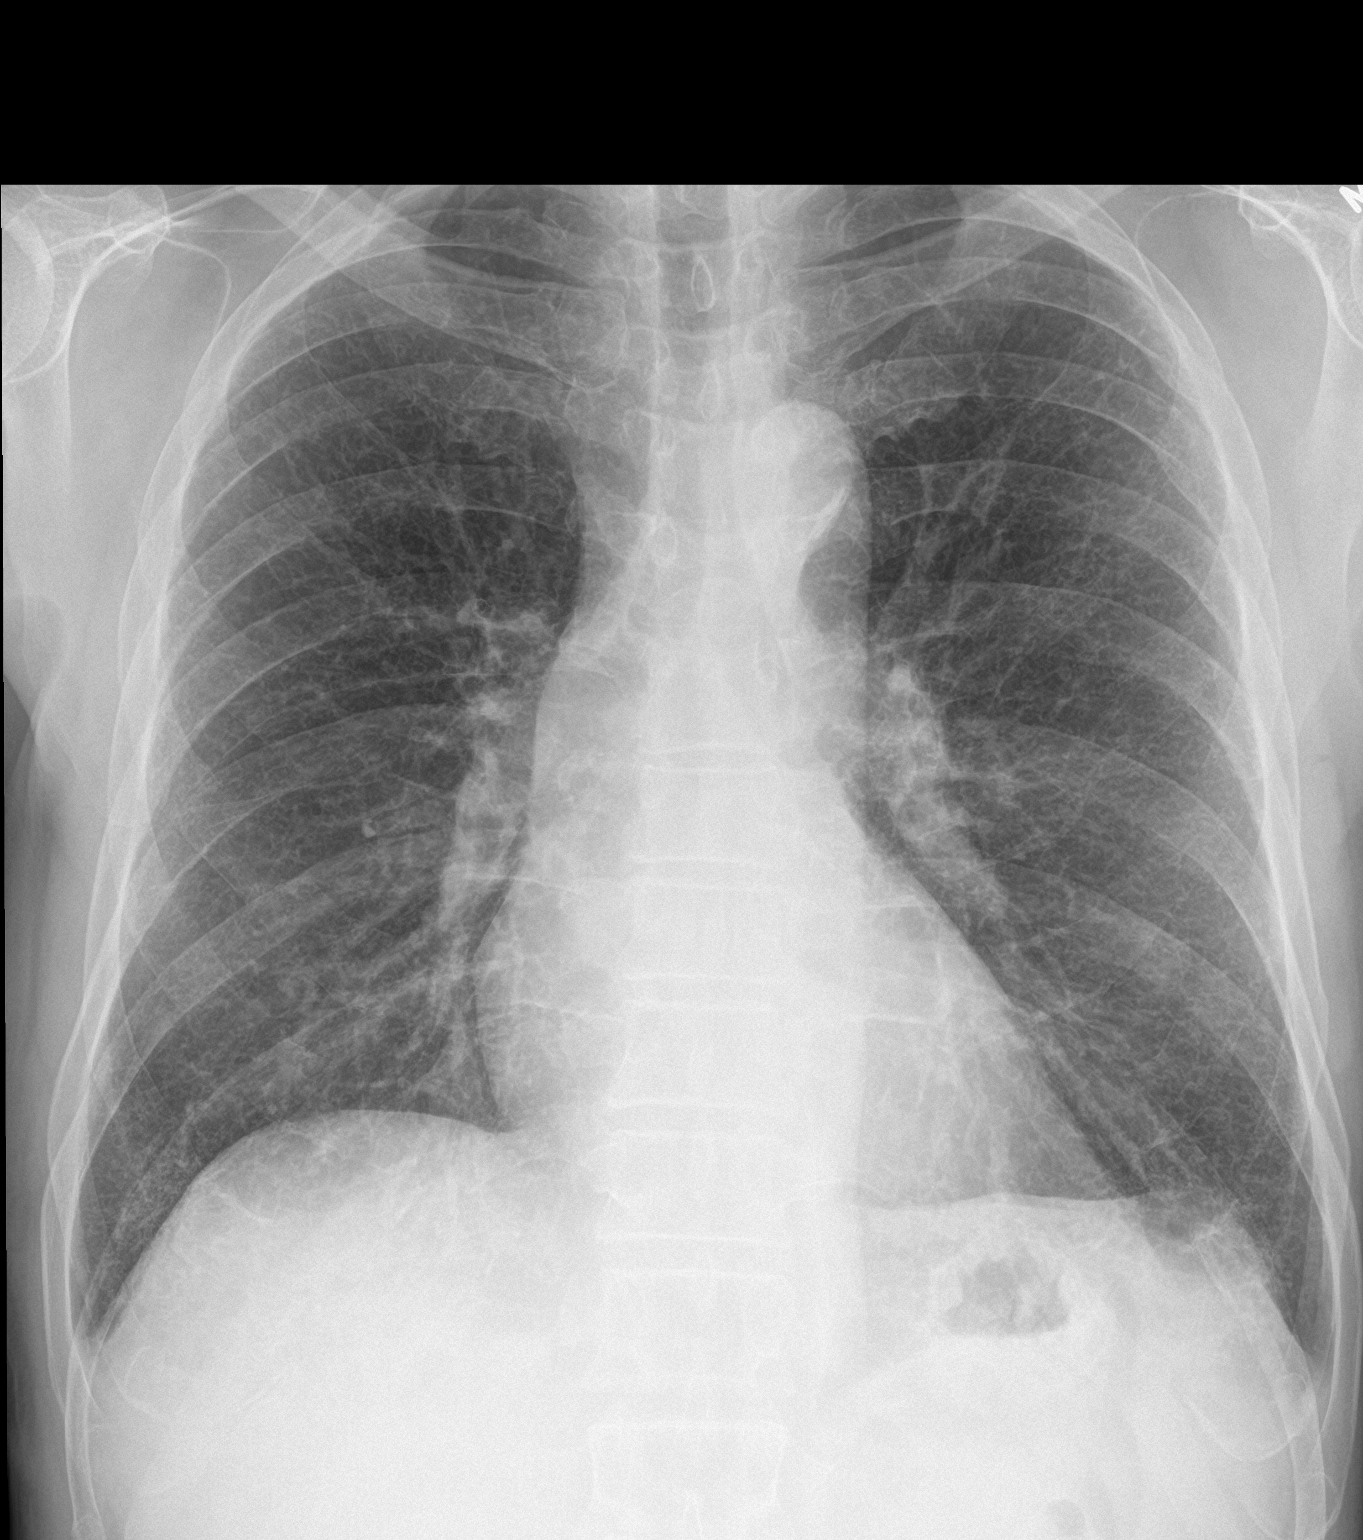

[chest lat]
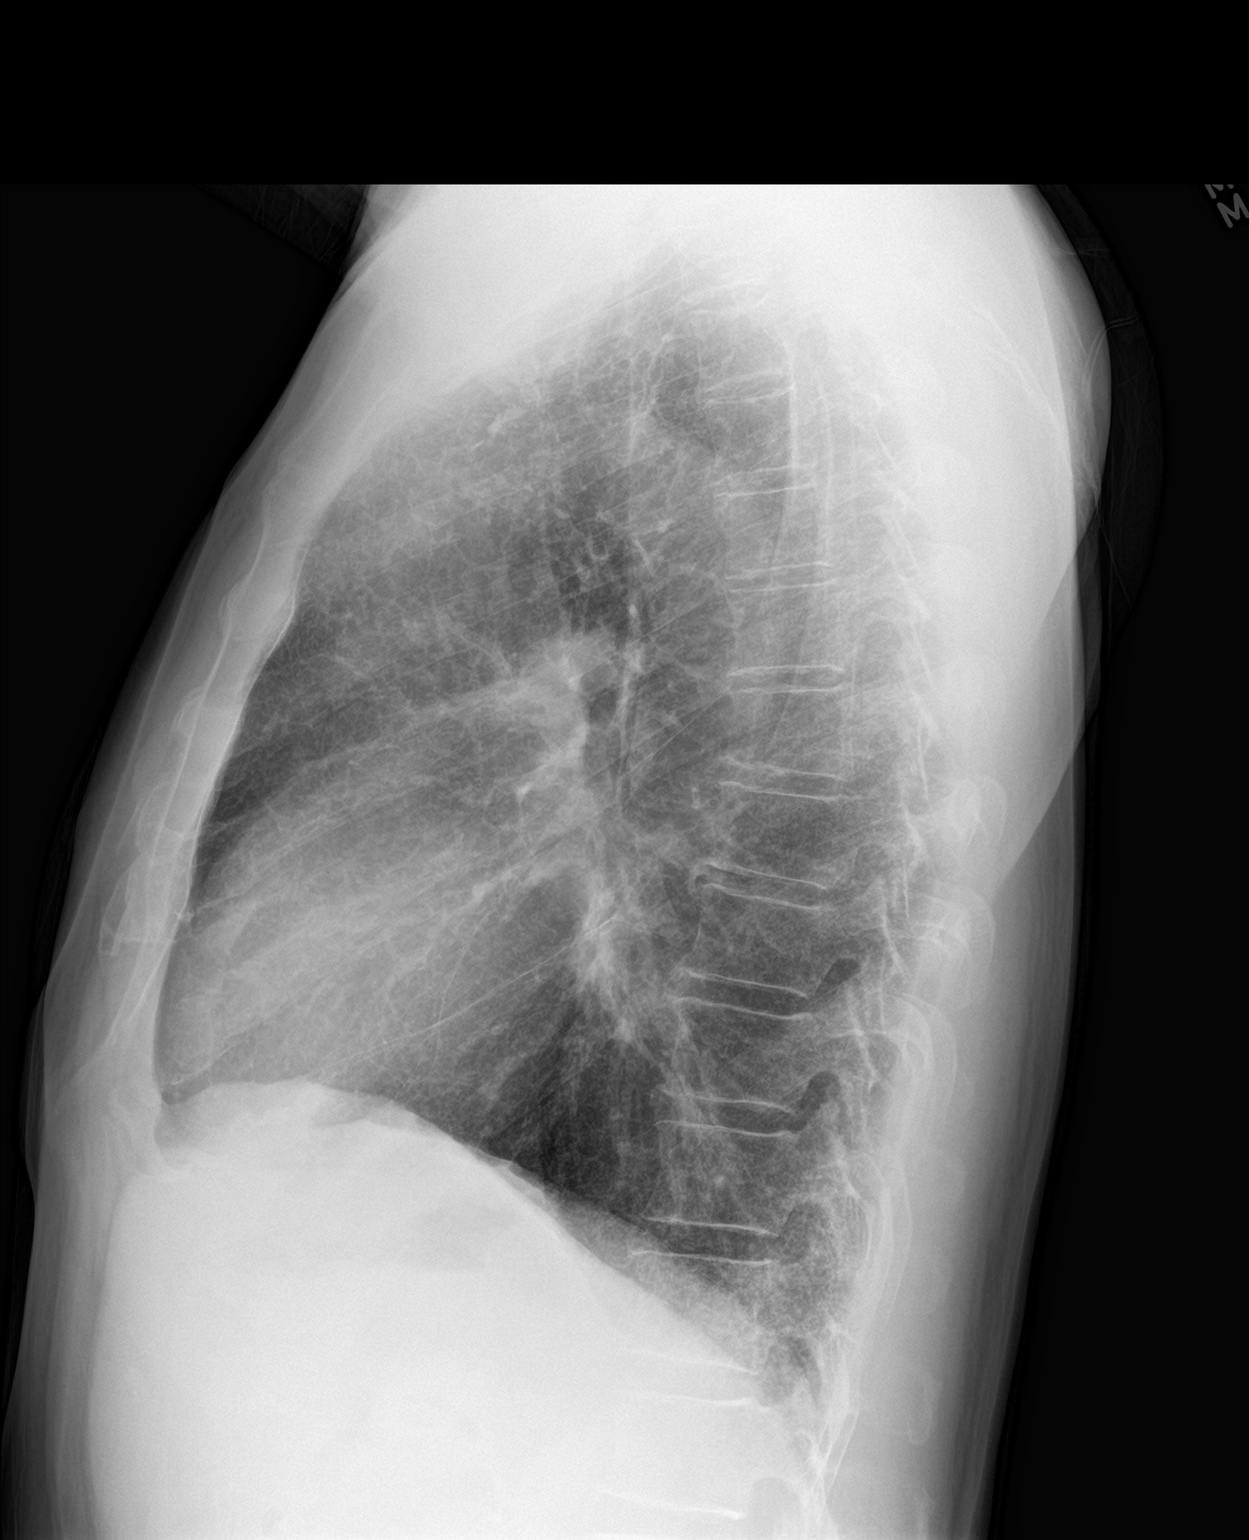

[2 of 2 positions shown; findings below may reference images not displayed]

FINDINGS: The heart size and mediastinal contours are within normal limits.
Thoracic aortic arch atherosclerosis. No aneurysm. Both lungs are
clear. There are fine bilateral, lower lobe predominant subpleural
areas of interstitial reticular markings consistent with fibrosis.
The 7 millimeter lingular nodule seen on CT is not apparent
radiographically. No pleural effusion. No acute osseous abnormality.
IMPRESSION: Subpleural interstitial fibrosis. No pneumonic consolidation,
effusion or CHF.
